# Patient Record
Sex: Male | Born: 1975 | Race: White | Hispanic: No | Marital: Married | Smoking: Former smoker
Health system: Southern US, Community
[De-identification: ages and names within clinical notes are randomized; demographics above are authoritative.]

---

## 2011-01-29 ENCOUNTER — Encounter: Payer: Self-pay | Admitting: Internal Medicine

## 2011-02-07 ENCOUNTER — Encounter: Payer: Self-pay | Admitting: Internal Medicine

## 2011-02-08 ENCOUNTER — Other Ambulatory Visit: Payer: Self-pay | Admitting: Internal Medicine

## 2011-02-08 ENCOUNTER — Other Ambulatory Visit: Payer: BC Managed Care – PPO | Admitting: Internal Medicine

## 2011-02-08 DIAGNOSIS — Z0000 Encounter for general adult medical examination without abnormal findings: Secondary | ICD-10-CM

## 2011-02-08 LAB — COMPREHENSIVE METABOLIC PANEL
ALT: 42 U/L (ref 0–53)
AST: 22 U/L (ref 0–37)
Albumin: 4.6 g/dL (ref 3.5–5.2)
CO2: 25 mEq/L (ref 19–32)
Calcium: 9.6 mg/dL (ref 8.4–10.5)
Chloride: 104 mEq/L (ref 96–112)
Potassium: 4.1 mEq/L (ref 3.5–5.3)
Sodium: 138 mEq/L (ref 135–145)
Total Protein: 7.6 g/dL (ref 6.0–8.3)

## 2011-02-08 LAB — LIPID PANEL: Cholesterol: 189 mg/dL (ref 0–200)

## 2011-02-08 LAB — CBC
MCV: 88.5 fL (ref 78.0–100.0)
Platelets: 320 10*3/uL (ref 150–400)
RBC: 4.97 MIL/uL (ref 4.22–5.81)
RDW: 13.2 % (ref 11.5–15.5)
WBC: 7.8 10*3/uL (ref 4.0–10.5)

## 2011-02-11 ENCOUNTER — Encounter: Payer: Self-pay | Admitting: Internal Medicine

## 2011-02-11 ENCOUNTER — Ambulatory Visit (INDEPENDENT_AMBULATORY_CARE_PROVIDER_SITE_OTHER): Payer: BC Managed Care – PPO | Admitting: Internal Medicine

## 2011-02-11 VITALS — BP 150/82 | HR 82 | Temp 96.9°F | Ht 69.0 in | Wt 220.0 lb

## 2011-02-11 DIAGNOSIS — I10 Essential (primary) hypertension: Secondary | ICD-10-CM

## 2011-02-11 DIAGNOSIS — E781 Pure hyperglyceridemia: Secondary | ICD-10-CM

## 2011-02-11 DIAGNOSIS — Z0000 Encounter for general adult medical examination without abnormal findings: Secondary | ICD-10-CM

## 2011-02-11 LAB — POCT URINALYSIS DIPSTICK
Glucose, UA: NEGATIVE
Ketones, UA: NEGATIVE
Leukocytes, UA: NEGATIVE
Spec Grav, UA: 1.03

## 2011-02-11 LAB — HEMOGLOBIN A1C
Hgb A1c MFr Bld: 5.9 % — ABNORMAL HIGH (ref <5.7)
Mean Plasma Glucose: 123 mg/dL — ABNORMAL HIGH (ref <117)

## 2011-02-11 MED ORDER — TETANUS-DIPHTH-ACELL PERTUSSIS 5-2.5-18.5 LF-MCG/0.5 IM SUSP
0.5000 mL | Freq: Once | INTRAMUSCULAR | Status: AC
  Administered 2011-02-11: 0.5 mL via INTRAMUSCULAR

## 2011-02-12 ENCOUNTER — Encounter: Payer: Self-pay | Admitting: Internal Medicine

## 2011-02-24 ENCOUNTER — Encounter: Payer: Self-pay | Admitting: Internal Medicine

## 2011-02-24 DIAGNOSIS — I10 Essential (primary) hypertension: Secondary | ICD-10-CM | POA: Insufficient documentation

## 2011-02-24 DIAGNOSIS — E781 Pure hyperglyceridemia: Secondary | ICD-10-CM | POA: Insufficient documentation

## 2011-02-24 NOTE — Progress Notes (Signed)
  Subjective:    Patient ID: Sergio Sparks, male    DOB: 02-01-1976, 35 y.o.   MRN: [id number]  HPI 35 year old white male presents to the office for the first time. 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 Here for health maintenance exam. Not aware of any medical problems. No history of serious illnesses, operations, accidents, fractures . No known drug allergies. No chronic medications. Not aware of last tetanus immunization. Blood pressure is elevated today at 150/82. He is overweight at 220 pounds. Has been concerned about his blood pressure recently. Says that he feels off and at times and thinks it might be do to his blood pressure. It is almost like a dizzy feeling. Both parents have hypertension and type 2 diabetes mellitus. One brother age 67 in good health. No sisters. He is married with a 11-year-old son. Wife works for [employer] of [address].    Review of Systems  Constitutional: Positive for fatigue.  HENT: Negative.   Eyes: Negative.   Cardiovascular: Positive for palpitations.  Gastrointestinal: Negative.   Genitourinary: Negative.   Musculoskeletal: Negative.   Skin: Negative.   Neurological: Negative.   Hematological: Negative.   Psychiatric/Behavioral:       Occasional mood swings       Objective:   Physical Exam  Vitals reviewed. Constitutional: He is oriented to person, place, and time. He appears well-developed and well-nourished. No distress.  HENT:  Head: Normocephalic and atraumatic.  Right Ear: External ear normal.  Left Ear: External ear normal.  Mouth/Throat: Oropharynx is clear and moist. No oropharyngeal exudate.  Eyes: Conjunctivae and EOM are normal. Pupils are equal, round, and reactive to light. Right eye exhibits no discharge. Left eye exhibits no discharge. No scleral icterus.  Neck: Neck supple. No JVD present. No thyromegaly present.  Cardiovascular: Normal rate, regular rhythm and normal heart  sounds.  Exam reveals no gallop.   No murmur heard. Pulmonary/Chest: Effort normal and breath sounds normal. No respiratory distress. He has no wheezes. He has no rales.  Abdominal: Soft. Bowel sounds are normal. He exhibits no distension and no mass.  Genitourinary: Penis normal.       Scrotum normal and no hernias palpated  Musculoskeletal: Normal range of motion. He exhibits no edema.  Lymphadenopathy:    He has no cervical adenopathy.  Neurological: He is alert and oriented to person, place, and time. He has normal reflexes. No cranial nerve deficit. Coordination normal.  Skin: Skin is warm and dry. No rash noted.  Psychiatric: He has a normal mood and affect. His behavior is normal. Judgment and thought content normal.          Assessment & Plan:  Hypertension  Obesity  Suspect diabetes with elevated fasting glucose of 102, strong family history, and elevated triglycerides. Add a hemoglobin A1c.  Hypertriglyceridemia  Plan is to place patient on statin therapy and ACE inhibitor therapy. Patient is to try diet and exercise for 6 weeks and return in 6 weeks for office visit, hemoglobin A1c and blood pressure check. Tetanus immunization given today

## 2011-03-15 ENCOUNTER — Encounter: Payer: Self-pay | Admitting: Internal Medicine

## 2011-03-15 ENCOUNTER — Ambulatory Visit (INDEPENDENT_AMBULATORY_CARE_PROVIDER_SITE_OTHER): Payer: BC Managed Care – PPO | Admitting: Internal Medicine

## 2011-03-15 VITALS — BP 124/78 | HR 72 | Temp 97.6°F

## 2011-03-15 DIAGNOSIS — Z23 Encounter for immunization: Secondary | ICD-10-CM

## 2011-03-26 ENCOUNTER — Ambulatory Visit (INDEPENDENT_AMBULATORY_CARE_PROVIDER_SITE_OTHER): Payer: BC Managed Care – PPO | Admitting: Internal Medicine

## 2011-03-26 ENCOUNTER — Other Ambulatory Visit: Payer: Self-pay | Admitting: Internal Medicine

## 2011-03-26 ENCOUNTER — Encounter: Payer: Self-pay | Admitting: Internal Medicine

## 2011-03-26 ENCOUNTER — Ambulatory Visit
Admission: RE | Admit: 2011-03-26 | Discharge: 2011-03-26 | Disposition: A | Discharge status: Home / Self Care | Payer: BC Managed Care – PPO | Source: Ambulatory Visit | Attending: Internal Medicine | Admitting: Internal Medicine

## 2011-03-26 DIAGNOSIS — H6690 Otitis media, unspecified, unspecified ear: Secondary | ICD-10-CM

## 2011-03-26 DIAGNOSIS — R0989 Other specified symptoms and signs involving the circulatory and respiratory systems: Secondary | ICD-10-CM

## 2011-03-26 DIAGNOSIS — H6693 Otitis media, unspecified, bilateral: Secondary | ICD-10-CM

## 2011-03-26 DIAGNOSIS — J40 Bronchitis, not specified as acute or chronic: Secondary | ICD-10-CM

## 2011-03-26 DIAGNOSIS — R7302 Impaired glucose tolerance (oral): Secondary | ICD-10-CM | POA: Insufficient documentation

## 2011-03-26 MED ORDER — CEFTRIAXONE SODIUM 1 G IJ SOLR
1.0000 g | Freq: Once | INTRAMUSCULAR | Status: AC
  Administered 2011-03-26: 1 g via INTRAMUSCULAR

## 2011-03-26 NOTE — Patient Instructions (Signed)
Take Levaquin 500 mg daily with a meal for 10 days. Use Ventolin inhaler 4 times daily for wheezing. May take Hycodan 1 teaspoon every 6 hours for cough but be careful with driving while taking this medication. Call if not better in 48-72 hours or sooner if worse

## 2011-03-26 NOTE — Progress Notes (Signed)
  Subjective:    Patient ID: Sergio Sparks, male    DOB: [birth year], 35 y.o.   MRN: [id number]  HPI 35 year old white male has come down with upper respiratory infection which he says he contracted from his son. Denies fever or shaking chills. Has had discolored sputum production. Some shortness of breath and wheezing. Says right ear is stopped up. Coughing a good deal. History of hypertension on Ramipril.  He's also on TriCor for hypertriglyceridemia. Recently found to have some impaired glucose intolerance just treated with diet at the present time with reassessment in February 2013. Blood pressure greatly improved on Ramipril.    Review of Systems     Objective:   Physical Exam both TMs are full and dull the right being worse than the left. Pharynx is red without exudate. Neck is supple without significant adenopathy. Chest: bilateral rhonchi and wheezing. Chest x-ray shows no infiltrate but bronchitic changes. Pulse oximetry 96% on room air.        Assessment & Plan:  Bronchitis  Reactive airways disease  Plan: Given 1 g IM Rocephin today in the office. Started on Levaquin 500 milligrams daily for 10 days. Ventolin inhaler generic 2 sprays by mouth 4 times daily for wheezing. Hycodan 8 ounces 1 teaspoon by mouth every 6 hours when necessary cough. Has appointment to return in February for reevaluation of hypertension hyperlipidemia and glucose intolerance

## 2011-03-30 NOTE — Patient Instructions (Signed)
Continue TriCor as directed. Continue Ramipril 5 milligrams daily. Watch diet and exercise. Return in 5 months at which time we will check fasting lipid panel, hemoglobin A1c and her blood pressure

## 2011-03-30 NOTE — Progress Notes (Signed)
  Subjective:    Patient ID: Kodiak Rollyson, male    DOB: 29-Nov-1975, 35 y.o.   MRN: [id number]  HPI here today to followup on hypertension. At last visit started on Ramipril 5 mg daily for hypertension and TriCor 145 mg daily for hyperlipidemia. Also found to have some glucose intolerance. He is overweight and needs to watch diet and exercise. We plan to followup with glucose intolerance in approximately 5 months. Influenza immunization given. No problems with Ramapo revealed.    Review of Systems     Objective:   Physical Exam chest clear; cardiac exam regular rate and rhythm normal S1 and S2; extremities without edema        Assessment & Plan:  Hypertension-  well-controlled on 5 mg daily Ramipril  Hyperlipidemia-treated with TriCor  Glucose intolerance-treated with diet and exercise  Plan return in 5 months for hemoglobin A1c and fasting lipid panel with office visit blood pressure check

## 2011-04-14 ENCOUNTER — Other Ambulatory Visit: Payer: Self-pay | Admitting: Internal Medicine

## 2011-06-08 ENCOUNTER — Other Ambulatory Visit: Payer: Self-pay | Admitting: Internal Medicine

## 2011-07-06 ENCOUNTER — Other Ambulatory Visit: Payer: Self-pay | Admitting: Internal Medicine

## 2011-07-26 ENCOUNTER — Encounter: Payer: Self-pay | Admitting: Internal Medicine

## 2011-07-26 ENCOUNTER — Ambulatory Visit (INDEPENDENT_AMBULATORY_CARE_PROVIDER_SITE_OTHER): Payer: BC Managed Care – PPO | Admitting: Internal Medicine

## 2011-07-26 DIAGNOSIS — I10 Essential (primary) hypertension: Secondary | ICD-10-CM

## 2011-07-26 DIAGNOSIS — R7309 Other abnormal glucose: Secondary | ICD-10-CM

## 2011-07-26 DIAGNOSIS — E785 Hyperlipidemia, unspecified: Secondary | ICD-10-CM

## 2011-07-26 DIAGNOSIS — R7302 Impaired glucose tolerance (oral): Secondary | ICD-10-CM

## 2011-07-26 DIAGNOSIS — Z131 Encounter for screening for diabetes mellitus: Secondary | ICD-10-CM

## 2011-07-26 NOTE — Patient Instructions (Signed)
Continue same medications. Return in 6 months for physical exam. Try the diet exercise and lose weight.

## 2011-07-26 NOTE — Progress Notes (Signed)
  Subjective:    Patient ID: Sergio Sparks, male    DOB: 1976/03/26, 36 y.o.   MRN: [id number]  HPI 36 year old white male with history of hypertension, hyperlipidemia, glucose intolerance in today for six-month recheck. Fasting lipid panel liver functions and hemoglobin A1c drawn. No complaints or problems today.    Review of Systems     Objective:   Physical Exam Neck is supple without thyromegaly or carotid bruits; chest clear to auscultation; cardiac exam regular rate and rhythm normal S1 and S2; extremities without edema        Assessment & Plan:  Hypertension  Hyperlipidemia  Glucose intolerance  Plan: Fasting labs drawn and are pending. Return in 6 months for physical examination. Continue current regimen. Blood pressure under good control.

## 2011-07-27 LAB — LIPID PANEL
Cholesterol: 159 mg/dL (ref 0–200)
HDL: 36 mg/dL — ABNORMAL LOW (ref >39)
Triglycerides: 83 mg/dL (ref <150)

## 2011-07-30 ENCOUNTER — Other Ambulatory Visit: Payer: Self-pay | Admitting: Internal Medicine

## 2011-08-29 ENCOUNTER — Other Ambulatory Visit: Payer: Self-pay | Admitting: Internal Medicine

## 2011-10-02 ENCOUNTER — Other Ambulatory Visit: Payer: Self-pay | Admitting: Internal Medicine

## 2011-10-27 ENCOUNTER — Other Ambulatory Visit: Payer: Self-pay | Admitting: Internal Medicine

## 2011-11-22 ENCOUNTER — Other Ambulatory Visit: Payer: Self-pay | Admitting: Internal Medicine

## 2011-11-25 ENCOUNTER — Other Ambulatory Visit: Payer: Self-pay

## 2011-11-25 MED ORDER — FENOFIBRATE 145 MG PO TABS: 145.0000 mg | ORAL_TABLET | Freq: Every day | ORAL | Status: DC

## 2011-11-25 MED ORDER — RAMIPRIL 10 MG PO CAPS: 10.0000 mg | ORAL_CAPSULE | Freq: Every day | ORAL | Status: DC

## 2012-01-23 ENCOUNTER — Other Ambulatory Visit: Payer: Self-pay

## 2012-01-23 MED ORDER — RAMIPRIL 10 MG PO CAPS: 10.0000 mg | ORAL_CAPSULE | Freq: Every day | ORAL | Status: DC

## 2012-01-23 MED ORDER — FENOFIBRATE 145 MG PO TABS: 145.0000 mg | ORAL_TABLET | Freq: Every day | ORAL | Status: DC

## 2012-02-25 ENCOUNTER — Ambulatory Visit (INDEPENDENT_AMBULATORY_CARE_PROVIDER_SITE_OTHER): Payer: BC Managed Care – PPO | Admitting: Internal Medicine

## 2012-02-25 ENCOUNTER — Encounter: Payer: Self-pay | Admitting: Internal Medicine

## 2012-02-25 VITALS — BP 116/82 | HR 76 | Ht 69.25 in | Wt 230.0 lb

## 2012-02-25 DIAGNOSIS — Z23 Encounter for immunization: Secondary | ICD-10-CM

## 2012-02-25 DIAGNOSIS — I10 Essential (primary) hypertension: Secondary | ICD-10-CM

## 2012-02-25 DIAGNOSIS — R7301 Impaired fasting glucose: Secondary | ICD-10-CM

## 2012-02-25 DIAGNOSIS — E781 Pure hyperglyceridemia: Secondary | ICD-10-CM

## 2012-02-25 LAB — CBC WITH DIFFERENTIAL/PLATELET
Basophils Relative: 0 % (ref 0–1)
Eosinophils Absolute: 0.2 10*3/uL (ref 0.0–0.7)
Eosinophils Relative: 3 % (ref 0–5)
HCT: 40.2 % (ref 39.0–52.0)
Hemoglobin: 13.5 g/dL (ref 13.0–17.0)
MCH: 28.5 pg (ref 26.0–34.0)
MCHC: 33.6 g/dL (ref 30.0–36.0)
MCV: 84.8 fL (ref 78.0–100.0)
Monocytes Absolute: 0.5 10*3/uL (ref 0.1–1.0)
Monocytes Relative: 7 % (ref 3–12)

## 2012-02-25 LAB — LIPID PANEL
Cholesterol: 196 mg/dL (ref 0–200)
HDL: 36 mg/dL — ABNORMAL LOW (ref >39)
LDL Cholesterol: 129 mg/dL — ABNORMAL HIGH (ref 0–99)
Triglycerides: 153 mg/dL — ABNORMAL HIGH (ref <150)
VLDL: 31 mg/dL (ref 0–40)

## 2012-02-25 LAB — COMPREHENSIVE METABOLIC PANEL
Alkaline Phosphatase: 59 U/L (ref 39–117)
BUN: 14 mg/dL (ref 6–23)
CO2: 27 mEq/L (ref 19–32)
Glucose, Bld: 87 mg/dL (ref 70–99)
Total Bilirubin: 0.5 mg/dL (ref 0.3–1.2)

## 2012-02-25 NOTE — Progress Notes (Signed)
  Subjective:    Patient ID: Sergio Sparks, male    DOB: [birth year], 36 y.o.   MRN: [id number]  HPI 36 year old white male in today for health maintenance exam. History of hypertension, obesity, impaired glucose tolerance, hyperlipidemia. Family history of obesity and his mother. Family history of hypertension and hyperlipidemia in his father. Tells me father was recently diagnosed with thyroid cancer which is new to his family history. Father is doing well after thyroidectomy. Patient has not been exercising much. He tried jogging for a while. Says eating a lot of his wife's good cooking. Doesn't really have any other sports-type hobbies that he enjoys.    Review of Systems denies any trouble with chest pain, shortness of breath, abdominal pain, bowel problems, joint problems     Objective:   Physical Exam  Vitals reviewed. Constitutional: He is oriented to person, place, and time. He appears well-developed and well-nourished. No distress.  HENT:  Head: Normocephalic and atraumatic.  Right Ear: External ear normal.  Left Ear: External ear normal.  Mouth/Throat: Oropharynx is clear and moist.  Eyes: Conjunctivae normal are normal. Pupils are equal, round, and reactive to light. Right eye exhibits no discharge. Left eye exhibits no discharge. No scleral icterus.  Neck: Normal range of motion. Neck supple. No JVD present. No thyromegaly present.  Cardiovascular: Normal rate, regular rhythm, normal heart sounds and intact distal pulses.   No murmur heard. Pulmonary/Chest: Effort normal and breath sounds normal. He has no wheezes. He has no rales.  Abdominal: Soft. Bowel sounds are normal. He exhibits no distension and no mass. There is no tenderness. There is no rebound and no guarding.  Genitourinary: Prostate normal and penis normal.  Musculoskeletal: Normal range of motion. He exhibits no edema and no tenderness.  Lymphadenopathy:    He has no cervical adenopathy.  Neurological: He is  alert and oriented to person, place, and time. He has normal reflexes. He displays normal reflexes. No cranial nerve deficit. He exhibits normal muscle tone. Coordination normal.  Skin: Skin is warm and dry. No rash noted. He is not diaphoretic.  Psychiatric: He has a normal mood and affect. His behavior is normal. Judgment and thought content normal.          Assessment & Plan:  Hypertension-stable on current regimen  Hyperlipidemia-fasting lipid panel pending on statin therapy  Impaired glucose tolerance-hemoglobin A1c pending  Obesity-has gained 17 pounds in the past 6 months  Health maintenance: Influenza immunization given today  Plan: Patient advised to diet exercise and lose weight. Return in 6 months for office visit lipid panel liver functions and hemoglobin A1c

## 2012-02-25 NOTE — Patient Instructions (Addendum)
Need to diet exercise and lose weight. Return in 6 months. Continue same medications. Flu shot given today.

## 2012-03-15 ENCOUNTER — Other Ambulatory Visit: Payer: Self-pay | Admitting: Internal Medicine

## 2012-03-16 ENCOUNTER — Other Ambulatory Visit: Payer: Self-pay

## 2012-03-16 MED ORDER — FENOFIBRATE 145 MG PO TABS: 145.0000 mg | ORAL_TABLET | Freq: Every day | ORAL | Status: DC

## 2012-03-16 MED ORDER — RAMIPRIL 10 MG PO CAPS: 10.0000 mg | ORAL_CAPSULE | Freq: Every day | ORAL | Status: DC

## 2012-08-11 ENCOUNTER — Ambulatory Visit (INDEPENDENT_AMBULATORY_CARE_PROVIDER_SITE_OTHER): Payer: BC Managed Care – PPO | Admitting: Internal Medicine

## 2012-08-11 ENCOUNTER — Encounter: Payer: Self-pay | Admitting: Internal Medicine

## 2012-08-11 VITALS — BP 124/82 | HR 80 | Temp 98.0°F | Wt 230.0 lb

## 2012-08-11 DIAGNOSIS — R7302 Impaired glucose tolerance (oral): Secondary | ICD-10-CM

## 2012-08-11 DIAGNOSIS — Z79899 Other long term (current) drug therapy: Secondary | ICD-10-CM

## 2012-08-11 DIAGNOSIS — R0989 Other specified symptoms and signs involving the circulatory and respiratory systems: Secondary | ICD-10-CM

## 2012-08-11 DIAGNOSIS — E781 Pure hyperglyceridemia: Secondary | ICD-10-CM

## 2012-08-11 DIAGNOSIS — R0683 Snoring: Secondary | ICD-10-CM | POA: Insufficient documentation

## 2012-08-11 DIAGNOSIS — R7309 Other abnormal glucose: Secondary | ICD-10-CM

## 2012-08-11 DIAGNOSIS — I10 Essential (primary) hypertension: Secondary | ICD-10-CM

## 2012-08-11 DIAGNOSIS — E669 Obesity, unspecified: Secondary | ICD-10-CM

## 2012-08-11 DIAGNOSIS — E785 Hyperlipidemia, unspecified: Secondary | ICD-10-CM

## 2012-08-11 LAB — HEPATIC FUNCTION PANEL
Alkaline Phosphatase: 61 U/L (ref 39–117)
Bilirubin, Direct: 0.1 mg/dL (ref 0.0–0.3)
Indirect Bilirubin: 0.3 mg/dL (ref 0.0–0.9)
Total Bilirubin: 0.4 mg/dL (ref 0.3–1.2)

## 2012-08-11 LAB — LIPID PANEL: LDL Cholesterol: 133 mg/dL — ABNORMAL HIGH (ref 0–99)

## 2012-08-11 NOTE — Patient Instructions (Addendum)
  Arrange for sleep study for evaluation of snoring. Hemoglobin A1c and fasting lipid panel checked today. Continue same antihypertensive medication. Continue TriCor. Return in 6 months for physical exam. Please try to diet exercise and lose weight.

## 2012-08-11 NOTE — Progress Notes (Signed)
  Subjective:    Patient ID: Sergio Sparks, male    DOB: 1976/03/31, 37 y.o.   MRN: [id number]  HPI 37 year old obese white male with history of hypertension, hypertriglyceridemia, impaired glucose tolerance in today for six-month recheck. He will be changing jobs at the end of the month. 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 He used to do this type of work before going to [employer]. Patient says his wife is complaining about his snoring. They suspect he may have some sleep apnea. He needs to have a sleep study in the near future.    Review of Systems     Objective:   Physical Exam Skin is warm and dry; chest clear to auscultation; cardiac exam regular rate and rhythm normal S1 and S2; extremities without edema. Neck is supple without thyromegaly JVD or carotid bruits        Assessment & Plan:  Hypertriglyceridemia-treated with TriCor  Impaired glucose tolerance  Hypertension-stable on medication  Obesity-needs to diet and exercise  Snoring-suspect sleep apnea  Plan: Fasting lipid panel drawn today along with hemoglobin A1c. Try to get sleep study in the next couple of weeks as he is changing jobs within the month

## 2012-08-12 NOTE — Addendum Note (Signed)
Addended by: Judy Pimple on: 08/12/2012 11:33 AM   Modules accepted: Orders

## 2012-08-21 ENCOUNTER — Ambulatory Visit: Payer: BC Managed Care – PPO | Admitting: Internal Medicine

## 2012-08-25 ENCOUNTER — Ambulatory Visit (HOSPITAL_BASED_OUTPATIENT_CLINIC_OR_DEPARTMENT_OTHER): Payer: BC Managed Care – PPO

## 2012-08-25 ENCOUNTER — Encounter (HOSPITAL_BASED_OUTPATIENT_CLINIC_OR_DEPARTMENT_OTHER): Payer: BC Managed Care – PPO

## 2012-12-01 ENCOUNTER — Other Ambulatory Visit: Payer: Self-pay

## 2012-12-01 ENCOUNTER — Other Ambulatory Visit: Payer: Self-pay | Admitting: Internal Medicine

## 2012-12-01 MED ORDER — FENOFIBRATE 145 MG PO TABS: 145.0000 mg | ORAL_TABLET | Freq: Every day | ORAL | Status: DC

## 2012-12-01 MED ORDER — RAMIPRIL 10 MG PO CAPS: 10.0000 mg | ORAL_CAPSULE | Freq: Every day | ORAL | Status: DC

## 2012-12-28 ENCOUNTER — Other Ambulatory Visit: Payer: Self-pay

## 2012-12-28 MED ORDER — FENOFIBRATE 145 MG PO TABS: 145.0000 mg | ORAL_TABLET | Freq: Every day | ORAL | Status: DC

## 2013-02-12 ENCOUNTER — Other Ambulatory Visit: Payer: BC Managed Care – PPO | Admitting: Internal Medicine

## 2013-02-12 ENCOUNTER — Encounter: Payer: BC Managed Care – PPO | Admitting: Internal Medicine

## 2013-02-12 VITALS — BP 138/72 | HR 64 | Temp 98.2°F | Resp 20 | Wt 200.0 lb

## 2013-02-12 DIAGNOSIS — Z13220 Encounter for screening for lipoid disorders: Secondary | ICD-10-CM

## 2013-02-12 DIAGNOSIS — Z0000 Encounter for general adult medical examination without abnormal findings: Secondary | ICD-10-CM

## 2013-02-12 DIAGNOSIS — Z131 Encounter for screening for diabetes mellitus: Secondary | ICD-10-CM

## 2013-02-12 LAB — CBC WITH DIFFERENTIAL/PLATELET
Eosinophils Absolute: 0.2 10*3/uL (ref 0.0–0.7)
Hemoglobin: 13.5 g/dL (ref 13.0–17.0)
Lymphocytes Relative: 36 % (ref 12–46)
Lymphs Abs: 2.6 10*3/uL (ref 0.7–4.0)
MCH: 29.3 pg (ref 26.0–34.0)
Monocytes Relative: 7 % (ref 3–12)
Neutro Abs: 4 10*3/uL (ref 1.7–7.7)
Neutrophils Relative %: 55 % (ref 43–77)
RBC: 4.61 MIL/uL (ref 4.22–5.81)
WBC: 7.1 10*3/uL (ref 4.0–10.5)

## 2013-02-12 LAB — COMPREHENSIVE METABOLIC PANEL
ALT: 28 U/L (ref 0–53)
Albumin: 4.6 g/dL (ref 3.5–5.2)
CO2: 28 mEq/L (ref 19–32)
Calcium: 9.9 mg/dL (ref 8.4–10.5)
Chloride: 104 mEq/L (ref 96–112)
Glucose, Bld: 87 mg/dL (ref 70–99)
Potassium: 4.4 mEq/L (ref 3.5–5.3)
Sodium: 138 mEq/L (ref 135–145)
Total Protein: 7.2 g/dL (ref 6.0–8.3)

## 2013-02-12 LAB — PSA: PSA: 0.14 ng/mL (ref <=4.00)

## 2013-02-12 LAB — LIPID PANEL: Cholesterol: 169 mg/dL (ref 0–200)

## 2013-02-12 LAB — HEMOGLOBIN A1C
Hgb A1c MFr Bld: 6.1 % — ABNORMAL HIGH (ref <5.7)
Mean Plasma Glucose: 128 mg/dL — ABNORMAL HIGH (ref <117)

## 2013-02-23 NOTE — Progress Notes (Signed)
  Subjective:    Patient ID: Sergio Sparks, male    DOB: 06/18/1975, 37 y.o.   MRN: [id number]  HPI  Pt. Has new job and insurance may not be in effect today. To return at a later date for PE. Fasting labs drawn today    Review of Systems     Objective:   Physical Exam not examined        Assessment & Plan:  HTN Hyperlipidemia Impaired glucose tolerance Plan: RTC at a later date for PE

## 2013-03-03 ENCOUNTER — Encounter: Payer: Self-pay | Admitting: Internal Medicine

## 2013-03-03 NOTE — Progress Notes (Signed)
  Subjective:    Patient ID: Sergio Sparks, male    DOB: 02-Aug-1975, 37 y.o.   MRN: [id number]  HPI Pe has been rescheduled until late Sept due to change in insurance coverage    Review of Systems     Objective:   Physical Exam        Assessment & Plan:

## 2013-03-04 ENCOUNTER — Encounter: Payer: BC Managed Care – PPO | Admitting: Internal Medicine

## 2013-03-05 ENCOUNTER — Other Ambulatory Visit: Payer: Self-pay | Admitting: Internal Medicine

## 2013-04-04 ENCOUNTER — Other Ambulatory Visit: Payer: Self-pay | Admitting: Internal Medicine

## 2013-04-22 ENCOUNTER — Encounter: Payer: Self-pay | Admitting: Internal Medicine

## 2013-04-22 ENCOUNTER — Ambulatory Visit (INDEPENDENT_AMBULATORY_CARE_PROVIDER_SITE_OTHER): Payer: BC Managed Care – PPO | Admitting: Internal Medicine

## 2013-04-22 VITALS — BP 142/94 | HR 88 | Temp 98.9°F | Ht 69.0 in | Wt 230.0 lb

## 2013-04-22 DIAGNOSIS — Z0000 Encounter for general adult medical examination without abnormal findings: Secondary | ICD-10-CM

## 2013-04-22 DIAGNOSIS — I10 Essential (primary) hypertension: Secondary | ICD-10-CM

## 2013-04-22 DIAGNOSIS — R7302 Impaired glucose tolerance (oral): Secondary | ICD-10-CM

## 2013-04-22 DIAGNOSIS — E785 Hyperlipidemia, unspecified: Secondary | ICD-10-CM

## 2013-04-22 DIAGNOSIS — R7309 Other abnormal glucose: Secondary | ICD-10-CM

## 2013-04-22 DIAGNOSIS — E8881 Metabolic syndrome: Secondary | ICD-10-CM

## 2013-04-22 DIAGNOSIS — Z23 Encounter for immunization: Secondary | ICD-10-CM

## 2013-04-22 LAB — POCT URINALYSIS DIPSTICK
Bilirubin, UA: NEGATIVE
Blood, UA: NEGATIVE
Glucose, UA: NEGATIVE
Nitrite, UA: NEGATIVE
Spec Grav, UA: 1.015
Urobilinogen, UA: 0.2

## 2013-08-01 NOTE — Patient Instructions (Signed)
Continue same medications. Watch diet and exercise. Return in 6 months. Tetanus update given today.

## 2013-08-01 NOTE — Progress Notes (Signed)
   Subjective:    Patient ID: Sergio ReamerBrandon Spradlin, male    DOB: 1976/05/24, 38 y.o.   MRN: [id number]  HPI 38 year old White male with history of hypertension, obesity, glucose intolerance, hypertriglyceridemia, snoring in today for health maintenance exam. Recently had second child. He is on TriCor and Ramipril. Glucose intolerance as currently diet controlled. Tetanus immunization update given today. Probably doesn't get a lot of exercise outside of work.  Social history: He's married. 2 children. Does not smoke. Wife is employed by [employer] [address]. Social alcohol consumption. Patient formerly employed by [employer] in  inside [employer] [place]. Now has new job.  Family history: Mother with history of obesity, hypertension, diabetes. Father with history of hypertension, hyperlipidemia, diabetes and thyroid cancer status post thyroidectomy. One brother in good health. No sisters.    Review of Systems  Constitutional: Negative.   All other systems reviewed and are negative.       Objective:   Physical Exam  Vitals reviewed. Constitutional: He is oriented to person, place, and time. He appears well-developed and well-nourished. No distress.  HENT:  Head: Normocephalic and atraumatic.  Right Ear: External ear normal.  Left Ear: External ear normal.  Mouth/Throat: Oropharynx is clear and moist. No oropharyngeal exudate.  Eyes: Conjunctivae and EOM are normal. Pupils are equal, round, and reactive to light. Right eye exhibits no discharge. Left eye exhibits no discharge. No scleral icterus.  Neck: Neck supple. No JVD present. No thyromegaly present.  Cardiovascular: Normal rate, regular rhythm, normal heart sounds and intact distal pulses.   No murmur heard. Pulmonary/Chest: Effort normal and breath sounds normal. He has no wheezes.  Abdominal: Soft. Bowel sounds are normal. He exhibits no distension and no mass. There is no tenderness. There is no rebound and no guarding.    Genitourinary:  No hernias to direct palpation  Musculoskeletal: Normal range of motion. He exhibits no edema.  Lymphadenopathy:    He has no cervical adenopathy.  Neurological: He is alert and oriented to person, place, and time. He has normal reflexes. He displays normal reflexes. No cranial nerve deficit. Coordination normal.  Skin: Skin is warm and dry. No rash noted. He is not diaphoretic.  Psychiatric: He has a normal mood and affect. His behavior is normal. Judgment and thought content normal.          Assessment & Plan:  Hypertension-stable on current medication  Impaired glucose tolerance-currently controlled with diet. Needs to exercise and watch diet  Hyperlipidemia-treated with TriCor  Obesity  Metabolic syndrome  Plan: Tetanus immunization update given today. Return in 6 months for office visit, lipid panel, and hemoglobin A1c as well as blood pressure check. Lab work in September showed he would've an A1c 6.1%. Lipids were stable. Fasting glucose normal.

## 2013-08-09 ENCOUNTER — Other Ambulatory Visit: Payer: Self-pay | Admitting: Internal Medicine

## 2013-09-07 ENCOUNTER — Other Ambulatory Visit: Payer: Self-pay | Admitting: Internal Medicine

## 2013-10-19 ENCOUNTER — Ambulatory Visit (INDEPENDENT_AMBULATORY_CARE_PROVIDER_SITE_OTHER): Payer: BC Managed Care – PPO | Admitting: Internal Medicine

## 2013-10-19 ENCOUNTER — Encounter: Payer: Self-pay | Admitting: Internal Medicine

## 2013-10-19 VITALS — BP 112/76 | HR 72 | Temp 98.6°F | Wt 231.0 lb

## 2013-10-19 DIAGNOSIS — I10 Essential (primary) hypertension: Secondary | ICD-10-CM

## 2013-10-19 DIAGNOSIS — E781 Pure hyperglyceridemia: Secondary | ICD-10-CM

## 2013-10-19 DIAGNOSIS — E8881 Metabolic syndrome: Secondary | ICD-10-CM

## 2013-10-19 DIAGNOSIS — R7301 Impaired fasting glucose: Secondary | ICD-10-CM

## 2013-10-19 LAB — LIPID PANEL
CHOL/HDL RATIO: 4.5 ratio
Cholesterol: 180 mg/dL (ref 0–200)
HDL: 40 mg/dL (ref >39)
LDL Cholesterol: 122 mg/dL — ABNORMAL HIGH (ref 0–99)
Triglycerides: 92 mg/dL (ref <150)
VLDL: 18 mg/dL (ref 0–40)

## 2013-10-19 LAB — BASIC METABOLIC PANEL
BUN: 12 mg/dL (ref 6–23)
CO2: 29 mEq/L (ref 19–32)
Calcium: 9.8 mg/dL (ref 8.4–10.5)
Chloride: 105 mEq/L (ref 96–112)
Creat: 0.95 mg/dL (ref 0.50–1.35)
GLUCOSE: 102 mg/dL — AB (ref 70–99)
POTASSIUM: 4.5 meq/L (ref 3.5–5.3)
Sodium: 139 mEq/L (ref 135–145)

## 2013-10-19 LAB — HEMOGLOBIN A1C
HEMOGLOBIN A1C: 6 % — AB (ref <5.7)
MEAN PLASMA GLUCOSE: 126 mg/dL — AB (ref <117)

## 2013-10-19 NOTE — Progress Notes (Signed)
   Subjective:    Patient ID: Sergio Sparks, male    DOB: 10/12/1975, 38 y.o.   MRN: [id number]  HPI  38 year old white male in today for six-month recheck on impaired fasting glucose, hypertension, hypertriglyceridemia. He's gained 1 pound since last visit. He's to lose at least 30 pounds. Talked with him about this again. Says he feels well. No complaints. Doesn't check Accu-Cheks at home. Fasting labs are pending.    Review of Systems     Objective:   Physical Exam  Skin warm and dry. Nodes none. Neck supple without thyromegaly JVD or carotid bruits. Chest clear.dictation. Cardiac exam regular rate and rhythm normal S1-S2. Extremities without edema. Foot exam without ulcers or calluses      Assessment & Plan:  Hypertension-stable Ramapo real  Obesity-needs to diet exercise and lose weight  Metabolic syndrome  Hypertriglyceridemia-lab results on TriCor are pending  Plan: Return in 6 months for physical exam.

## 2013-10-19 NOTE — Patient Instructions (Signed)
Try the diet exercise and lose weight. Return in 6 months for physical exam. Continue same medications

## 2013-12-07 ENCOUNTER — Other Ambulatory Visit: Payer: Self-pay | Admitting: Internal Medicine

## 2014-03-01 ENCOUNTER — Other Ambulatory Visit: Payer: Self-pay | Admitting: Internal Medicine

## 2014-03-01 DIAGNOSIS — R0683 Snoring: Secondary | ICD-10-CM

## 2014-03-15 ENCOUNTER — Telehealth: Payer: Self-pay | Admitting: Internal Medicine

## 2014-03-15 NOTE — Telephone Encounter (Signed)
Recent sleep study showed 20.2 bands per hour. Lowest O2 sat was 58%. Longest respiratory event 57.5 seconds in duration. Moderate snoring noted. [facility] sleep disorders recommending CPAP titration. Order signed.

## 2014-03-27 ENCOUNTER — Other Ambulatory Visit: Payer: Self-pay | Admitting: Internal Medicine

## 2014-04-25 ENCOUNTER — Other Ambulatory Visit: Payer: Self-pay | Admitting: Internal Medicine

## 2014-04-26 ENCOUNTER — Ambulatory Visit (INDEPENDENT_AMBULATORY_CARE_PROVIDER_SITE_OTHER): Payer: BC Managed Care – PPO | Admitting: Internal Medicine

## 2014-04-26 ENCOUNTER — Encounter: Payer: Self-pay | Admitting: Internal Medicine

## 2014-04-26 VITALS — BP 118/80 | HR 75 | Temp 98.1°F | Ht 69.0 in | Wt 230.0 lb

## 2014-04-26 DIAGNOSIS — G4733 Obstructive sleep apnea (adult) (pediatric): Secondary | ICD-10-CM

## 2014-04-26 DIAGNOSIS — I10 Essential (primary) hypertension: Secondary | ICD-10-CM

## 2014-04-26 DIAGNOSIS — R7302 Impaired glucose tolerance (oral): Secondary | ICD-10-CM

## 2014-04-26 DIAGNOSIS — E8881 Metabolic syndrome: Secondary | ICD-10-CM

## 2014-04-26 DIAGNOSIS — E781 Pure hyperglyceridemia: Secondary | ICD-10-CM

## 2014-04-26 DIAGNOSIS — Z0000 Encounter for general adult medical examination without abnormal findings: Secondary | ICD-10-CM

## 2014-04-26 LAB — COMPREHENSIVE METABOLIC PANEL
ALT: 18 U/L (ref 0–53)
AST: 18 U/L (ref 0–37)
Albumin: 4.3 g/dL (ref 3.5–5.2)
Alkaline Phosphatase: 65 U/L (ref 39–117)
BILIRUBIN TOTAL: 0.3 mg/dL (ref 0.2–1.2)
BUN: 13 mg/dL (ref 6–23)
CALCIUM: 9.1 mg/dL (ref 8.4–10.5)
CHLORIDE: 105 meq/L (ref 96–112)
CO2: 23 mEq/L (ref 19–32)
CREATININE: 0.98 mg/dL (ref 0.50–1.35)
Glucose, Bld: 92 mg/dL (ref 70–99)
Potassium: 4.6 mEq/L (ref 3.5–5.3)
Sodium: 138 mEq/L (ref 135–145)
Total Protein: 6.9 g/dL (ref 6.0–8.3)

## 2014-04-26 LAB — LIPID PANEL
CHOL/HDL RATIO: 4.5 ratio
Cholesterol: 148 mg/dL (ref 0–200)
HDL: 33 mg/dL — AB (ref >39)
LDL Cholesterol: 99 mg/dL (ref 0–99)
Triglycerides: 80 mg/dL (ref <150)
VLDL: 16 mg/dL (ref 0–40)

## 2014-04-26 LAB — CBC WITH DIFFERENTIAL/PLATELET
BASOS ABS: 0 10*3/uL (ref 0.0–0.1)
BASOS PCT: 0 % (ref 0–1)
EOS ABS: 0.2 10*3/uL (ref 0.0–0.7)
EOS PCT: 3 % (ref 0–5)
HCT: 37.5 % — ABNORMAL LOW (ref 39.0–52.0)
Hemoglobin: 12.4 g/dL — ABNORMAL LOW (ref 13.0–17.0)
LYMPHS ABS: 2.2 10*3/uL (ref 0.7–4.0)
Lymphocytes Relative: 34 % (ref 12–46)
MCH: 28.4 pg (ref 26.0–34.0)
MCHC: 33.1 g/dL (ref 30.0–36.0)
MCV: 86 fL (ref 78.0–100.0)
MPV: 9.1 fL — ABNORMAL LOW (ref 9.4–12.4)
Monocytes Absolute: 0.5 10*3/uL (ref 0.1–1.0)
Monocytes Relative: 7 % (ref 3–12)
NEUTROS PCT: 56 % (ref 43–77)
Neutro Abs: 3.7 10*3/uL (ref 1.7–7.7)
PLATELETS: 456 10*3/uL — AB (ref 150–400)
RBC: 4.36 MIL/uL (ref 4.22–5.81)
RDW: 13.2 % (ref 11.5–15.5)
WBC: 6.6 10*3/uL (ref 4.0–10.5)

## 2014-04-26 LAB — POCT URINALYSIS DIPSTICK
Bilirubin, UA: NEGATIVE
Blood, UA: NEGATIVE
Glucose, UA: NEGATIVE
KETONES UA: NEGATIVE
LEUKOCYTES UA: NEGATIVE
Nitrite, UA: NEGATIVE
PH UA: 6.5
Protein, UA: NEGATIVE
Spec Grav, UA: 1.01
UROBILINOGEN UA: NEGATIVE

## 2014-04-26 LAB — HEMOGLOBIN A1C
Hgb A1c MFr Bld: 6 % — ABNORMAL HIGH (ref <5.7)
Mean Plasma Glucose: 126 mg/dL — ABNORMAL HIGH (ref <117)

## 2014-04-26 NOTE — Patient Instructions (Signed)
Labs will be reviewed. Return in 6 months. Recommend diet exercise and weight loss.

## 2014-04-26 NOTE — Progress Notes (Signed)
   Subjective:    Patient ID: Sergio Sparks, male    DOB: 06-Nov-1975, 38 y.o.   MRN: [id number]  HPI  38 year old White Male in today for health maintenance exam and evaluation of medical problems. He was recently diagnosed with sleep apnea. His insurance would not pay for CPAP machine. He borrowed a family member's CPAP machine with setting of 13 which was recommended. He's doing well and feels better. He has a history of hypertension, obesity, glucose intolerance, hypertriglyceridemia, metabolic syndrome. Glucose intolerance is currently diet controlled.  Tetanus immunization update given 2014.  Social history: He is married. Has 2 children. Does not smoke. Wife is employed by [employer] [address]. Social alcohol consumption. He is now working mainly in [place].  Family history: Mother with history of obesity, hypertension, diabetes. Father with history of hypertension, hyperlipidemia, diabetes thyroid cancer status post thyroidectomy. One brother in good health. No sisters.    Review of Systems  Constitutional: Negative.   All other systems reviewed and are negative.      Objective:   Physical Exam  Constitutional: He is oriented to person, place, and time. He appears well-developed and well-nourished. No distress.  HENT:  Head: Normocephalic and atraumatic.  Right Ear: External ear normal.  Left Ear: External ear normal.  Mouth/Throat: Oropharynx is clear and moist. No oropharyngeal exudate.  Eyes: Conjunctivae are normal. Pupils are equal, round, and reactive to light. Right eye exhibits no discharge. Left eye exhibits no discharge. No scleral icterus.  Neck: Neck supple. No JVD present. No thyromegaly present.  Cardiovascular: Normal rate, regular rhythm and normal heart sounds.   No murmur heard. Pulmonary/Chest: Breath sounds normal. No respiratory distress. He has no wheezes. He has no rales.  Abdominal: Soft. Bowel sounds are normal. He exhibits no distension and no  mass. There is no tenderness. There is no rebound and no guarding.  Genitourinary:  No hernias to direct palpation. Testicles normal  Musculoskeletal: Normal range of motion. He exhibits no edema.  Lymphadenopathy:    He has no cervical adenopathy.  Neurological: He is alert and oriented to person, place, and time. He has normal reflexes. He displays normal reflexes. No cranial nerve deficit. Coordination normal.  Skin: Skin is warm and dry. No rash noted. He is not diaphoretic.  Psychiatric: He has a normal mood and affect. His behavior is normal. Judgment and thought content normal.  Vitals reviewed.         Assessment & Plan:  Obesity  Metabolic syndrome  Impaired glucose tolerance  Hypertriglyceridemia  Hypertension  Sleep apnea  Plan: Fasting lab work drawn and pending. Will be reviewed tomorrow. Further recommendations to follow if necessary. Otherwise return in 6 months. Recommend diet exercise and weight loss which would help sleep apnea and  medical conditions.

## 2014-10-05 ENCOUNTER — Other Ambulatory Visit: Payer: Self-pay | Admitting: Internal Medicine

## 2014-10-05 NOTE — Telephone Encounter (Signed)
Appt in June already made. Refill through June

## 2014-11-03 ENCOUNTER — Ambulatory Visit: Payer: BC Managed Care – PPO | Admitting: Internal Medicine

## 2014-11-10 ENCOUNTER — Encounter: Payer: Self-pay | Admitting: Internal Medicine

## 2014-11-10 ENCOUNTER — Other Ambulatory Visit: Payer: BLUE CROSS/BLUE SHIELD | Admitting: Internal Medicine

## 2014-11-10 ENCOUNTER — Ambulatory Visit (INDEPENDENT_AMBULATORY_CARE_PROVIDER_SITE_OTHER): Payer: BLUE CROSS/BLUE SHIELD | Admitting: Internal Medicine

## 2014-11-10 VITALS — BP 110/70 | HR 77 | Temp 98.0°F | Wt 233.0 lb

## 2014-11-10 DIAGNOSIS — E8881 Metabolic syndrome: Secondary | ICD-10-CM | POA: Diagnosis not present

## 2014-11-10 DIAGNOSIS — E669 Obesity, unspecified: Secondary | ICD-10-CM | POA: Diagnosis not present

## 2014-11-10 DIAGNOSIS — M722 Plantar fascial fibromatosis: Secondary | ICD-10-CM

## 2014-11-10 DIAGNOSIS — J069 Acute upper respiratory infection, unspecified: Secondary | ICD-10-CM | POA: Diagnosis not present

## 2014-11-10 DIAGNOSIS — I10 Essential (primary) hypertension: Secondary | ICD-10-CM | POA: Diagnosis not present

## 2014-11-10 DIAGNOSIS — G4733 Obstructive sleep apnea (adult) (pediatric): Secondary | ICD-10-CM | POA: Diagnosis not present

## 2014-11-10 DIAGNOSIS — E781 Pure hyperglyceridemia: Secondary | ICD-10-CM | POA: Diagnosis not present

## 2014-11-10 DIAGNOSIS — R7302 Impaired glucose tolerance (oral): Secondary | ICD-10-CM | POA: Diagnosis not present

## 2014-11-10 DIAGNOSIS — E786 Lipoprotein deficiency: Secondary | ICD-10-CM

## 2014-11-10 DIAGNOSIS — R7309 Other abnormal glucose: Secondary | ICD-10-CM

## 2014-11-10 DIAGNOSIS — J029 Acute pharyngitis, unspecified: Secondary | ICD-10-CM | POA: Diagnosis not present

## 2014-11-10 LAB — LIPID PANEL
CHOL/HDL RATIO: 4.1 ratio
Cholesterol: 107 mg/dL (ref 0–200)
HDL: 26 mg/dL — AB (ref >=40)
LDL CALC: 69 mg/dL (ref 0–99)
TRIGLYCERIDES: 61 mg/dL (ref <150)
VLDL: 12 mg/dL (ref 0–40)

## 2014-11-10 LAB — POCT RAPID STREP A (OFFICE): RAPID STREP A SCREEN: NEGATIVE

## 2014-11-10 MED ORDER — AZITHROMYCIN 250 MG PO TABS: ORAL_TABLET | ORAL | Status: DC

## 2014-11-10 NOTE — Progress Notes (Signed)
   Subjective:    Patient ID: Sergio Sparks, male    DOB: 08-20-1975, 39 y.o.   MRN: [id number]  HPI  For follow up of medical issues including hypertension, obesity, hypertriglyceridemia, impaired glucose tolerance metabolic syndrome, snoring and sleep apnea with C- pap apparatus. Also having issues with plantar fasciitis left foot. Exercises demonstrated to help with that.   Review of Systems Onset last week fever chills and sore throat     Objective:   Physical Exam Pharynx slightly injected without exudate. TMs are clear. Neck is supple without thyromegaly JVD or carotid bruits. No adenopathy. Chest clear to auscultation. Diabetic foot exam within normal limits. Remains obese       Assessment & Plan:  Essential hypertension-stable on medication  Obesity-encouraged diet exercise and weight loss  Hypertriglyceridemia-stable on triglyceride lowering medication- lipid panel shows normal LDL and total cholesterol and triglycerides  Low HDL cholesterol-low at 26  Impaired glucose tolerance-hemoglobin A1c stable at 6%  Snoring and sleep apnea-has C pap apparatus  Pharyngitis-rapid strep screen negative.  Plan: Zithromax Z-Pak take as directed for sore throat symptoms. Continue same medications and return in 6 months. Encouraged diet exercise and weight loss.

## 2014-11-10 NOTE — Patient Instructions (Addendum)
Labs pending. Take Z-pak as directed. Continue C- Pap. Diet exercise and weight loss suggested. Return in 6 months for physical exam

## 2014-11-11 LAB — MICROALBUMIN / CREATININE URINE RATIO
Creatinine, Urine: 207.5 mg/dL
MICROALB UR: 0.3 mg/dL (ref <2.0)
Microalb Creat Ratio: 1.4 mg/g (ref 0.0–30.0)

## 2014-11-11 LAB — HEMOGLOBIN A1C
HEMOGLOBIN A1C: 6 % — AB (ref <5.7)
MEAN PLASMA GLUCOSE: 126 mg/dL — AB (ref <117)

## 2014-11-27 DIAGNOSIS — E8881 Metabolic syndrome: Secondary | ICD-10-CM | POA: Insufficient documentation

## 2014-12-08 ENCOUNTER — Telehealth: Payer: Self-pay | Admitting: Internal Medicine

## 2014-12-08 NOTE — Telephone Encounter (Addendum)
Patient has new insurance and they will not cover his Fenofibrate - Tricor.  He wants to know if there is another one he can use.

## 2014-12-08 NOTE — Telephone Encounter (Signed)
Patient will call insurance company and will let us know what is covered for triglycerides

## 2014-12-08 NOTE — Telephone Encounter (Signed)
He will need to call his insurance company and see what is on the formulary for triglycerides and let us know.

## 2014-12-09 ENCOUNTER — Other Ambulatory Visit: Payer: Self-pay | Admitting: *Deleted

## 2014-12-09 MED ORDER — FENOFIBRATE 160 MG PO TABS: 160.0000 mg | ORAL_TABLET | Freq: Every day | ORAL | Status: DC

## 2014-12-09 NOTE — Telephone Encounter (Signed)
Tricor dosage changed due to insurance coverage per Dr Lenord FellersBaxley

## 2015-01-02 ENCOUNTER — Other Ambulatory Visit: Payer: Self-pay | Admitting: Internal Medicine

## 2015-05-05 ENCOUNTER — Encounter: Payer: BLUE CROSS/BLUE SHIELD | Admitting: Internal Medicine

## 2015-05-16 ENCOUNTER — Encounter: Payer: Self-pay | Admitting: Internal Medicine

## 2015-05-16 ENCOUNTER — Ambulatory Visit (INDEPENDENT_AMBULATORY_CARE_PROVIDER_SITE_OTHER): Payer: 59 | Admitting: Internal Medicine

## 2015-05-16 VITALS — BP 112/72 | HR 73 | Temp 97.7°F | Resp 20 | Ht 70.0 in | Wt 233.0 lb

## 2015-05-16 DIAGNOSIS — E669 Obesity, unspecified: Secondary | ICD-10-CM

## 2015-05-16 DIAGNOSIS — G4730 Sleep apnea, unspecified: Secondary | ICD-10-CM | POA: Diagnosis not present

## 2015-05-16 DIAGNOSIS — E786 Lipoprotein deficiency: Secondary | ICD-10-CM

## 2015-05-16 DIAGNOSIS — R7302 Impaired glucose tolerance (oral): Secondary | ICD-10-CM | POA: Diagnosis not present

## 2015-05-16 DIAGNOSIS — R748 Abnormal levels of other serum enzymes: Secondary | ICD-10-CM

## 2015-05-16 DIAGNOSIS — E781 Pure hyperglyceridemia: Secondary | ICD-10-CM | POA: Diagnosis not present

## 2015-05-16 DIAGNOSIS — E8881 Metabolic syndrome: Secondary | ICD-10-CM

## 2015-05-16 DIAGNOSIS — I10 Essential (primary) hypertension: Secondary | ICD-10-CM

## 2015-05-16 DIAGNOSIS — R7309 Other abnormal glucose: Secondary | ICD-10-CM

## 2015-05-16 DIAGNOSIS — Z0000 Encounter for general adult medical examination without abnormal findings: Secondary | ICD-10-CM

## 2015-05-16 LAB — CBC WITH DIFFERENTIAL/PLATELET
BASOS ABS: 0 10*3/uL (ref 0.0–0.1)
BASOS PCT: 0 % (ref 0–1)
Eosinophils Absolute: 0.2 10*3/uL (ref 0.0–0.7)
Eosinophils Relative: 3 % (ref 0–5)
HEMATOCRIT: 41.8 % (ref 39.0–52.0)
HEMOGLOBIN: 13.6 g/dL (ref 13.0–17.0)
LYMPHS PCT: 30 % (ref 12–46)
Lymphs Abs: 2 10*3/uL (ref 0.7–4.0)
MCH: 28 pg (ref 26.0–34.0)
MCHC: 32.5 g/dL (ref 30.0–36.0)
MCV: 86.2 fL (ref 78.0–100.0)
MPV: 9.1 fL (ref 8.6–12.4)
Monocytes Absolute: 0.4 10*3/uL (ref 0.1–1.0)
Monocytes Relative: 6 % (ref 3–12)
NEUTROS ABS: 4.1 10*3/uL (ref 1.7–7.7)
NEUTROS PCT: 61 % (ref 43–77)
Platelets: 390 10*3/uL (ref 150–400)
RBC: 4.85 MIL/uL (ref 4.22–5.81)
RDW: 13.1 % (ref 11.5–15.5)
WBC: 6.7 10*3/uL (ref 4.0–10.5)

## 2015-05-16 LAB — POCT URINALYSIS DIPSTICK
Bilirubin, UA: NEGATIVE
Blood, UA: NEGATIVE
Glucose, UA: NEGATIVE
KETONES UA: NEGATIVE
Leukocytes, UA: NEGATIVE
Nitrite, UA: NEGATIVE
PH UA: 6.5
PROTEIN UA: NEGATIVE
SPEC GRAV UA: 1.02
UROBILINOGEN UA: 0.2

## 2015-05-16 LAB — COMPLETE METABOLIC PANEL WITH GFR
ALBUMIN: 4.3 g/dL (ref 3.6–5.1)
ALK PHOS: 52 U/L (ref 40–115)
ALT: 30 U/L (ref 9–46)
AST: 21 U/L (ref 10–40)
BUN: 12 mg/dL (ref 7–25)
CALCIUM: 9.6 mg/dL (ref 8.6–10.3)
CO2: 26 mmol/L (ref 20–31)
CREATININE: 0.94 mg/dL (ref 0.60–1.35)
Chloride: 100 mmol/L (ref 98–110)
GFR, Est African American: >89 mL/min (ref >=60)
GFR, Est Non African American: >89 mL/min (ref >=60)
Glucose, Bld: 93 mg/dL (ref 65–99)
POTASSIUM: 4.5 mmol/L (ref 3.5–5.3)
SODIUM: 138 mmol/L (ref 135–146)
Total Bilirubin: 0.3 mg/dL (ref 0.2–1.2)
Total Protein: 7.1 g/dL (ref 6.1–8.1)

## 2015-05-16 LAB — LIPID PANEL
CHOL/HDL RATIO: 4.8 ratio (ref <=5.0)
Cholesterol: 154 mg/dL (ref 125–200)
HDL: 32 mg/dL — ABNORMAL LOW (ref >=40)
LDL Cholesterol: 103 mg/dL (ref <130)
Triglycerides: 94 mg/dL (ref <150)
VLDL: 19 mg/dL (ref <30)

## 2015-05-16 NOTE — Progress Notes (Signed)
   Subjective:    Patient ID: Sergio ReamerBrandon Sparks, male    DOB: 05-26-1976, 39 y.o.   MRN: [id number]  HPI 39 year old White Male in today for health maintenance exam and evaluation of medical issues. History of sleep apnea, hypertension, obesity, glucose intolerance, hypertriglyceridemia, metabolic syndrome. Glucose intolerance is currently diet control.  Tetanus immunization given 2014.  Social history: He is married. Has 2 children. Does not smoke. Wife is employed by [employer] of [address]. Social alcohol consumption. He doesn't do as much physical labor and walking as he used to. 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 Does wrote and ride horses on the weekends.  Family history: Mother with history of obesity, hypertension, diabetes. Father with history of hypertension, hyperlipidemia, diabetes, thyroid cancer status post thyroidectomy. One brother in good health. No sisters.    Review of Systems  Constitutional: Negative.   All other systems reviewed and are negative.      Objective:   Physical Exam  Constitutional: He is oriented to person, place, and time. He appears well-developed and well-nourished. No distress.  HENT:  Head: Normocephalic and atraumatic.  Right Ear: External ear normal.  Left Ear: External ear normal.  Mouth/Throat: Oropharynx is clear and moist. No oropharyngeal exudate.  Eyes: Conjunctivae and EOM are normal. Pupils are equal, round, and reactive to light. Right eye exhibits no discharge.  Neck: Neck supple. No JVD present. No thyromegaly present.  Cardiovascular: Normal rate, regular rhythm and normal heart sounds.   No murmur heard. Pulmonary/Chest: Breath sounds normal.  Abdominal: Bowel sounds are normal. He exhibits no distension and no mass. There is no tenderness. There is no rebound.  Genitourinary:  No hernias  Musculoskeletal: He exhibits no edema.  Lymphadenopathy:    He has no cervical adenopathy.  Neurological: He is alert  and oriented to person, place, and time. He has normal reflexes. No cranial nerve deficit.  Skin: Skin is warm and dry. No rash noted. He is not diaphoretic.  Psychiatric: He has a normal mood and affect. His behavior is normal. Judgment and thought content normal.  Vitals reviewed.         Assessment & Plan:  Obesity  Metabolic syndrome  Essential hypertension  Impaired glucose tolerance  Hypertriglyceridemia  Sleep apnea  Plan: Spent time talking with him about diet. Could try to get more exercise at work. Needs to watch caloric consumption. Return in 6 months or as needed.

## 2015-05-17 LAB — HEMOGLOBIN A1C
Hgb A1c MFr Bld: 6.1 % — ABNORMAL HIGH (ref <5.7)
Mean Plasma Glucose: 128 mg/dL — ABNORMAL HIGH (ref <117)

## 2015-05-17 LAB — MICROALBUMIN, URINE: Microalb, Ur: 0.2 mg/dL

## 2015-05-17 LAB — PSA: PSA: 0.14 ng/mL (ref <=4.00)

## 2015-06-01 ENCOUNTER — Other Ambulatory Visit: Payer: Self-pay

## 2015-06-01 MED ORDER — FENOFIBRATE 160 MG PO TABS: 160.0000 mg | ORAL_TABLET | Freq: Every day | ORAL | Status: DC

## 2015-06-01 MED ORDER — RAMIPRIL 10 MG PO CAPS: 10.0000 mg | ORAL_CAPSULE | Freq: Every day | ORAL | Status: DC

## 2015-06-03 DIAGNOSIS — E786 Lipoprotein deficiency: Secondary | ICD-10-CM | POA: Insufficient documentation

## 2015-06-03 NOTE — Patient Instructions (Signed)
Watch diet and try to exercise more. Return in 6 months.

## 2015-06-27 ENCOUNTER — Other Ambulatory Visit: Payer: Self-pay | Admitting: Internal Medicine

## 2015-10-25 ENCOUNTER — Other Ambulatory Visit: Payer: Self-pay | Admitting: Internal Medicine

## 2015-11-10 ENCOUNTER — Encounter: Payer: Self-pay | Admitting: Internal Medicine

## 2015-11-10 ENCOUNTER — Ambulatory Visit (INDEPENDENT_AMBULATORY_CARE_PROVIDER_SITE_OTHER): Payer: 59 | Admitting: Internal Medicine

## 2015-11-10 VITALS — BP 124/76 | HR 71 | Temp 98.4°F | Resp 18 | Wt 228.0 lb

## 2015-11-10 DIAGNOSIS — Z79899 Other long term (current) drug therapy: Secondary | ICD-10-CM

## 2015-11-10 DIAGNOSIS — E785 Hyperlipidemia, unspecified: Secondary | ICD-10-CM

## 2015-11-10 DIAGNOSIS — E8881 Metabolic syndrome: Secondary | ICD-10-CM

## 2015-11-10 DIAGNOSIS — J0100 Acute maxillary sinusitis, unspecified: Secondary | ICD-10-CM

## 2015-11-10 DIAGNOSIS — E119 Type 2 diabetes mellitus without complications: Secondary | ICD-10-CM

## 2015-11-10 DIAGNOSIS — E669 Obesity, unspecified: Secondary | ICD-10-CM

## 2015-11-10 DIAGNOSIS — I10 Essential (primary) hypertension: Secondary | ICD-10-CM

## 2015-11-10 LAB — HEPATIC FUNCTION PANEL
ALBUMIN: 4.4 g/dL (ref 3.6–5.1)
ALK PHOS: 61 U/L (ref 40–115)
ALT: 22 U/L (ref 9–46)
AST: 16 U/L (ref 10–40)
Bilirubin, Direct: 0.1 mg/dL (ref <=0.2)
Indirect Bilirubin: 0.3 mg/dL (ref 0.2–1.2)
TOTAL PROTEIN: 7.2 g/dL (ref 6.1–8.1)
Total Bilirubin: 0.4 mg/dL (ref 0.2–1.2)

## 2015-11-10 LAB — LIPID PANEL
CHOL/HDL RATIO: 4 ratio (ref <=5.0)
Cholesterol: 143 mg/dL (ref 125–200)
HDL: 36 mg/dL — AB (ref >=40)
LDL CALC: 90 mg/dL (ref <130)
TRIGLYCERIDES: 84 mg/dL (ref <150)
VLDL: 17 mg/dL (ref <30)

## 2015-11-10 LAB — HEMOGLOBIN A1C
HEMOGLOBIN A1C: 5.7 % — AB (ref <5.7)
Mean Plasma Glucose: 117 mg/dL

## 2015-11-10 MED ORDER — RAMIPRIL 10 MG PO CAPS: ORAL_CAPSULE | ORAL | Status: DC

## 2015-11-10 MED ORDER — AMOXICILLIN 500 MG PO CAPS: 500.0000 mg | ORAL_CAPSULE | Freq: Three times a day (TID) | ORAL | Status: DC

## 2015-11-10 MED ORDER — FENOFIBRATE 160 MG PO TABS: ORAL_TABLET | ORAL | Status: DC

## 2015-11-10 NOTE — Progress Notes (Signed)
   Subjective:    Patient ID: Sergio Sparks, male    DOB: December 19, 1975, 40 y.o.   MRN: [id number]  HPI  40 year old white male in today for six-month recheck on impaired glucose tolerance, essential hypertension, hypertriglyceridemia, obesity, metabolic syndrome. Blood pressures under excellent control. 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 Doesn't get a lot of exercise. Has lost 5 pounds since December.  For couple of months he's had what he describes as a sinus infection with nasal congestion, some discolored nasal drainage and sore throat. Symptoms are persistent but have improved.    Review of Systems see above     Objective:   Physical Exam  Skin warm and dry. Pharynx slightly injected. TMs are clear. Neck is supple without JVD thyromegaly or cervical adenopathy. Chest clear to auscultation without rales or wheezing. Extremities without edema.      Assessment & Plan:  Acute maxillary sinusitis  Essential hypertension  Impaired glucose tolerance  Obesity  Metabolic syndrome  Hypertriglyceridemia  Plan: Fasting lipid panel and hemoglobin A1c drawn and pending. For sinusitis amoxicillin 500 mg 3 times daily for 10 days. Return in 6 months for physical exam. Continued work on diet and exercise.

## 2015-11-10 NOTE — Patient Instructions (Signed)
Amoxicillin 500 mg 3 times daily for 10 days for sinusitis. Continue same medications. Fasting labs drawn and pending. Recommend annual eye exam. Return in 6 months. Continue to work on diet and exercise.

## 2015-11-11 LAB — MICROALBUMIN, URINE: Microalb, Ur: 0.3 mg/dL

## 2016-01-03 ENCOUNTER — Other Ambulatory Visit: Payer: Self-pay | Admitting: Internal Medicine

## 2016-03-15 ENCOUNTER — Other Ambulatory Visit: Payer: Self-pay

## 2016-03-15 MED ORDER — RAMIPRIL 10 MG PO CAPS: 10.0000 mg | ORAL_CAPSULE | Freq: Every day | ORAL | 3 refills | Status: DC

## 2016-03-15 MED ORDER — FENOFIBRATE 160 MG PO TABS: ORAL_TABLET | ORAL | 3 refills | Status: DC

## 2016-05-17 ENCOUNTER — Ambulatory Visit (INDEPENDENT_AMBULATORY_CARE_PROVIDER_SITE_OTHER): Payer: Managed Care, Other (non HMO) | Admitting: Internal Medicine

## 2016-05-17 ENCOUNTER — Encounter: Payer: Self-pay | Admitting: Internal Medicine

## 2016-05-17 VITALS — BP 122/80 | HR 87 | Temp 98.1°F | Ht 70.0 in | Wt 233.0 lb

## 2016-05-17 DIAGNOSIS — I10 Essential (primary) hypertension: Secondary | ICD-10-CM

## 2016-05-17 DIAGNOSIS — E8881 Metabolic syndrome: Secondary | ICD-10-CM | POA: Diagnosis not present

## 2016-05-17 DIAGNOSIS — R7302 Impaired glucose tolerance (oral): Secondary | ICD-10-CM | POA: Diagnosis not present

## 2016-05-17 DIAGNOSIS — E781 Pure hyperglyceridemia: Secondary | ICD-10-CM | POA: Diagnosis not present

## 2016-05-17 DIAGNOSIS — Z23 Encounter for immunization: Secondary | ICD-10-CM

## 2016-05-17 DIAGNOSIS — Z6833 Body mass index (BMI) 33.0-33.9, adult: Secondary | ICD-10-CM

## 2016-05-17 DIAGNOSIS — Z0000 Encounter for general adult medical examination without abnormal findings: Secondary | ICD-10-CM

## 2016-05-17 DIAGNOSIS — G4733 Obstructive sleep apnea (adult) (pediatric): Secondary | ICD-10-CM | POA: Diagnosis not present

## 2016-05-17 LAB — COMPLETE METABOLIC PANEL WITH GFR
ALBUMIN: 4.4 g/dL (ref 3.6–5.1)
ALK PHOS: 57 U/L (ref 40–115)
ALT: 31 U/L (ref 9–46)
AST: 21 U/L (ref 10–40)
BILIRUBIN TOTAL: 0.4 mg/dL (ref 0.2–1.2)
BUN: 17 mg/dL (ref 7–25)
CALCIUM: 9.5 mg/dL (ref 8.6–10.3)
CO2: 23 mmol/L (ref 20–31)
Chloride: 105 mmol/L (ref 98–110)
Creat: 0.98 mg/dL (ref 0.60–1.35)
GFR, Est Non African American: >89 mL/min (ref >=60)
Glucose, Bld: 91 mg/dL (ref 65–99)
Potassium: 4.5 mmol/L (ref 3.5–5.3)
Sodium: 140 mmol/L (ref 135–146)
TOTAL PROTEIN: 7.1 g/dL (ref 6.1–8.1)

## 2016-05-17 LAB — CBC WITH DIFFERENTIAL/PLATELET
BASOS PCT: 0 %
Basophils Absolute: 0 cells/uL (ref 0–200)
EOS ABS: 240 {cells}/uL (ref 15–500)
Eosinophils Relative: 3 %
HEMATOCRIT: 43.6 % (ref 38.5–50.0)
HEMOGLOBIN: 14.2 g/dL (ref 13.2–17.1)
LYMPHS ABS: 2160 {cells}/uL (ref 850–3900)
LYMPHS PCT: 27 %
MCH: 29 pg (ref 27.0–33.0)
MCHC: 32.6 g/dL (ref 32.0–36.0)
MCV: 89 fL (ref 80.0–100.0)
MONO ABS: 480 {cells}/uL (ref 200–950)
MPV: 8.9 fL (ref 7.5–12.5)
Monocytes Relative: 6 %
Neutro Abs: 5120 cells/uL (ref 1500–7800)
Neutrophils Relative %: 64 %
Platelets: 399 10*3/uL (ref 140–400)
RBC: 4.9 MIL/uL (ref 4.20–5.80)
RDW: 13.3 % (ref 11.0–15.0)
WBC: 8 10*3/uL (ref 3.8–10.8)

## 2016-05-17 LAB — LIPID PANEL
CHOLESTEROL: 171 mg/dL (ref <200)
HDL: 37 mg/dL — ABNORMAL LOW (ref >40)
LDL Cholesterol: 116 mg/dL — ABNORMAL HIGH (ref <100)
TRIGLYCERIDES: 90 mg/dL (ref <150)
Total CHOL/HDL Ratio: 4.6 Ratio (ref <5.0)
VLDL: 18 mg/dL (ref <30)

## 2016-05-17 LAB — HEMOGLOBIN A1C
Hgb A1c MFr Bld: 5.6 % (ref <5.7)
MEAN PLASMA GLUCOSE: 114 mg/dL

## 2016-05-17 NOTE — Progress Notes (Signed)
   Subjective:    Patient ID: Clarene ReamerBrandon Kamath, male    DOB: 28-Mar-1976, 40 y.o.   MRN: [id number]  HPI  40 year old Male for health maintenance exam and evaluation of medical issues.  History of impaired glucose tolerance, sleep apnea, hypertension, obesity, hypertriglyceridemia, metabolic syndrome.  Tetanus immunization given 2014.  Social history: He is married he has 2 children. Does not smoke. Wife is employed by [employer] of [address]. Social alcohol consumption. 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 He does ride horses on the weekends.  Family history: Mother with history of obesity, hypertension, diabetes. Father with history of hypertension, hyperlipidemia, diabetes, thyroid cancer status post thyroidectomy. One brother in good health. No sisters.      Review of Systems no new complaints      Objective:   Physical Exam  Constitutional: He appears well-developed and well-nourished. No distress.  HENT:  Head: Normocephalic and atraumatic.  Right Ear: External ear normal.  Left Ear: External ear normal.  Mouth/Throat: Oropharynx is clear and moist. No oropharyngeal exudate.  Eyes: Conjunctivae and EOM are normal. Pupils are equal, round, and reactive to light.  Neck: Neck supple. No JVD present. No tracheal deviation present. No thyromegaly present.  Cardiovascular: Normal rate, regular rhythm and normal heart sounds.   No murmur heard. Pulmonary/Chest: Effort normal and breath sounds normal.  Lymphadenopathy:    He has no cervical adenopathy.  Skin: He is not diaphoretic.  Vitals reviewed.         Assessment & Plan:  Essential hypertension-stable on current regimen  Impaired glucose tolerance-11 A1c is 5.6% in 6 months ago was 5.7%. This is excellent  Hyperlipidemia-hypertriglyceridemia on medication. 5 years ago, triglycerides were 351 before starting medication.  Obesity-encouraged diet exercise and weight  loss  Metabolic syndrome  Sleep apnea- has C pap apparatus  Plan: Continue same medications and return in 6 months. Encouraged diet exercise and weight loss.

## 2016-05-18 LAB — MICROALBUMIN, URINE: MICROALB UR: 0.2 mg/dL

## 2016-05-20 ENCOUNTER — Telehealth: Payer: Self-pay | Admitting: Internal Medicine

## 2016-05-20 LAB — POCT URINALYSIS DIPSTICK
Bilirubin, UA: NEGATIVE
Glucose, UA: NEGATIVE
Ketones, UA: NEGATIVE
Leukocytes, UA: NEGATIVE
NITRITE UA: NEGATIVE
PROTEIN UA: NEGATIVE
RBC UA: NEGATIVE
SPEC GRAV UA: 1.01
UROBILINOGEN UA: NEGATIVE
pH, UA: 6

## 2016-05-20 NOTE — Telephone Encounter (Signed)
Left message informing patient the reaction is local and should resolve itself.

## 2016-05-20 NOTE — Telephone Encounter (Signed)
Patient called complaining about feeling bad.  He says that he received the pneumonia vaccine Friday and by that evening he was running a low grade fever and his arm was sore.  He says what ever it was "hit him like a ton of bricks".  He wanted to know if this was a normal side effect from the injection.  He says the site was warm to touch as well.  Please advise.

## 2016-05-20 NOTE — Telephone Encounter (Signed)
A local reaction that will resolve.

## 2016-06-02 ENCOUNTER — Encounter: Payer: Self-pay | Admitting: Internal Medicine

## 2016-06-02 NOTE — Patient Instructions (Signed)
It was pleasure to see today. Continue diet exercise and weight loss efforts. Continue same medications and return in 6 months.

## 2016-11-15 ENCOUNTER — Encounter: Payer: Self-pay | Admitting: Internal Medicine

## 2016-11-15 ENCOUNTER — Ambulatory Visit (INDEPENDENT_AMBULATORY_CARE_PROVIDER_SITE_OTHER): Payer: Managed Care, Other (non HMO) | Admitting: Internal Medicine

## 2016-11-15 VITALS — BP 120/64 | HR 61 | Temp 97.2°F | Wt 224.0 lb

## 2016-11-15 DIAGNOSIS — G4733 Obstructive sleep apnea (adult) (pediatric): Secondary | ICD-10-CM

## 2016-11-15 DIAGNOSIS — E8881 Metabolic syndrome: Secondary | ICD-10-CM

## 2016-11-15 DIAGNOSIS — E781 Pure hyperglyceridemia: Secondary | ICD-10-CM | POA: Diagnosis not present

## 2016-11-15 DIAGNOSIS — E669 Obesity, unspecified: Secondary | ICD-10-CM | POA: Diagnosis not present

## 2016-11-15 DIAGNOSIS — R7302 Impaired glucose tolerance (oral): Secondary | ICD-10-CM

## 2016-11-15 DIAGNOSIS — J0100 Acute maxillary sinusitis, unspecified: Secondary | ICD-10-CM | POA: Diagnosis not present

## 2016-11-15 DIAGNOSIS — I10 Essential (primary) hypertension: Secondary | ICD-10-CM

## 2016-11-15 LAB — LIPID PANEL
CHOL/HDL RATIO: 3.6 ratio (ref <5.0)
Cholesterol: 118 mg/dL (ref <200)
HDL: 33 mg/dL — ABNORMAL LOW (ref >40)
LDL Cholesterol: 71 mg/dL (ref <100)
Triglycerides: 72 mg/dL (ref <150)
VLDL: 14 mg/dL (ref <30)

## 2016-11-15 MED ORDER — AZITHROMYCIN 250 MG PO TABS: ORAL_TABLET | ORAL | 0 refills | Status: DC

## 2016-11-15 NOTE — Progress Notes (Signed)
   Subjective:    Patient ID: Sergio Sparks, male    DOB: 1975/06/28, 41 y.o.   MRN: [id number]  HPI  41 year old Male for 6 month recheck on obesity, impaired glucose tolerance, hypertension, hypertriglyceridemia, sleep apnea.  Weighs 224 lbs today and was 233 lbs at last visit- 9 pound weight loss. Had  Triglycerides of 351 in 2012. Now normal on medication.   Patient had local reaction to Prevnar 13 and declines Pneumovax 23.   Smokes on occasion but not very often. Occasionally will smoke if having a beer.  Having sinusitis symptoms. Nasal congestion but no fever or chills. Present for at least a couple of weeks. Would like antibiotic.  Review of Systems see above     Objective:   Physical Exam TMs are clear. Pharynx is clear. Sounds a bit nasally congested. Neck supple. Chest clear to auscultation. Cardiac exam regular rate and rhythm normal S1 and S2  Extremities without edema. See diabetic foot exam.       Assessment & Plan:  Maxillary sinusitis-Zithromax Z-PAK prescribed take 2 tablets day one followed by 1 tablet days 2 through 5.  Obesity-patient advised to continue diet exercise and weight loss efforts. Would like weight to be less than 200 pounds.  Hypertriglyceridemia-labs pending  Impaired glucose tolerance-labs pending  Essential hypertension-controlled with medication  Plan: Continue same medications, continue diet and exercise efforts and return in 6 months for physical examination.

## 2016-11-15 NOTE — Patient Instructions (Signed)
Labs are pending. Continue diet exercise and weight loss efforts and return in 6 months. For maxillary sinusitis, take Zithromax Z-PAK as directed

## 2016-11-16 LAB — MICROALBUMIN / CREATININE URINE RATIO: CREATININE, URINE: 164 mg/dL (ref 20–370)

## 2016-11-16 LAB — HEMOGLOBIN A1C
HEMOGLOBIN A1C: 5.5 % (ref <5.7)
MEAN PLASMA GLUCOSE: 111 mg/dL

## 2017-02-19 ENCOUNTER — Telehealth: Payer: Self-pay

## 2017-02-19 MED ORDER — FENOFIBRATE 160 MG PO TABS: ORAL_TABLET | ORAL | 0 refills | Status: DC

## 2017-02-19 MED ORDER — RAMIPRIL 10 MG PO CAPS: 10.0000 mg | ORAL_CAPSULE | Freq: Every day | ORAL | 0 refills | Status: DC

## 2017-02-19 NOTE — Telephone Encounter (Signed)
Pt called and stated that he needed one month supply of his medication sent to [facility] where he is working. One month supply sent appt is in December for his CPE.

## 2017-03-15 ENCOUNTER — Other Ambulatory Visit: Payer: Self-pay | Admitting: Internal Medicine

## 2017-04-15 ENCOUNTER — Other Ambulatory Visit: Payer: Self-pay | Admitting: Internal Medicine

## 2017-04-15 NOTE — Telephone Encounter (Signed)
Refill x 90 days 

## 2017-04-15 NOTE — Telephone Encounter (Signed)
Will he be able to keep appt December 21st? Heard he had moved to [place]. Please advise.

## 2017-04-15 NOTE — Telephone Encounter (Signed)
No he will not be able to make the appointment and he said that he still comes home regularly so he does not want to switch doctors at this time. He had to cancel his appointment because he does not land until the 22nd of December. Stated he will call back to reschedule once he gets his new calendar. Please advise on refills.

## 2017-05-23 ENCOUNTER — Encounter: Payer: Managed Care, Other (non HMO) | Admitting: Internal Medicine

## 2017-06-09 ENCOUNTER — Other Ambulatory Visit: Payer: Self-pay | Admitting: Internal Medicine

## 2017-06-20 ENCOUNTER — Other Ambulatory Visit: Payer: Self-pay | Admitting: Internal Medicine

## 2019-06-18 ENCOUNTER — Telehealth: Payer: Self-pay | Admitting: Internal Medicine

## 2019-06-18 NOTE — Telephone Encounter (Signed)
Scheduled appt for Monday, he has an eye appointment next week at [facility]

## 2019-06-18 NOTE — Telephone Encounter (Signed)
Sergio Sparks [phone]  Thorn called to say he is having double vision, he has an appointment with eye doctor, but they said they do not treat double vision.

## 2019-06-18 NOTE — Telephone Encounter (Signed)
Please call him. We have not seen him in over 2 years. Is he still taking BP meds. Did he call optometrist or opthalmologist? We can try to work him in soon. How long has this been going on?

## 2019-06-21 ENCOUNTER — Other Ambulatory Visit: Payer: Self-pay

## 2019-06-21 ENCOUNTER — Encounter: Payer: Self-pay | Admitting: Internal Medicine

## 2019-06-21 ENCOUNTER — Ambulatory Visit: Payer: Managed Care, Other (non HMO) | Admitting: Internal Medicine

## 2019-06-21 VITALS — BP 110/80 | HR 74 | Temp 98.0°F | Ht 70.0 in | Wt 223.0 lb

## 2019-06-21 DIAGNOSIS — Z0000 Encounter for general adult medical examination without abnormal findings: Secondary | ICD-10-CM

## 2019-06-21 DIAGNOSIS — H532 Diplopia: Secondary | ICD-10-CM | POA: Diagnosis not present

## 2019-06-21 DIAGNOSIS — R7302 Impaired glucose tolerance (oral): Secondary | ICD-10-CM | POA: Diagnosis not present

## 2019-06-21 DIAGNOSIS — Z13220 Encounter for screening for lipoid disorders: Secondary | ICD-10-CM

## 2019-06-21 DIAGNOSIS — I10 Essential (primary) hypertension: Secondary | ICD-10-CM

## 2019-06-21 DIAGNOSIS — E781 Pure hyperglyceridemia: Secondary | ICD-10-CM | POA: Diagnosis not present

## 2019-06-21 NOTE — Progress Notes (Signed)
   Subjective:    Patient ID: Sergio Sparks, male    DOB: 03/29/1976, 44 y.o.   MRN: [id number]  HPI 44 year old Male not seen here since June 2018. He used to work in [place] but has been relocated to the [place] area. His home is in [place], [place] about 30 miles from [place]. He has a new baby, a daughter one week old.  He has a history of impaired glucose tolerance,sleep apnea, hypertension, hypertriglyceridemia and metabolic syndrome. Says he came off lipid lowering med as he lost weight.  Fasting labs drawn today and are pending.  New issues since apprx. mid December is vertical double vision. He has upcoming ophthalmology appt with [facility] here in [place]. It is in both eyes to about the same degree. It improves if he tilts his head backward a bit. Onset was sudden. No history of head trauma. Never had this before. He has been reading on the internet and is concerned there is a neurological problem.    Review of Systems negative nausea vomiting or vertigo.     Objective:   Physical Exam Blood pressure 110/80 BMI 32.00, pulse 74, pulse oximetry 98% weight 223 pounds  Skin warm and dry.  Nodes none.  PERRLA.  Extraocular movements are full.  No nystagmus is demonstrated.  TMs are clear.  He is alert and oriented x3.  His affect is normal.  His chest is clear to auscultation without rales or wheezing.  No carotid bruits.  No adenopathy.  No thyromegaly.  Cardiac exam regular rate and rhythm normal S1 and S2 without ectopy.  No lower extremity edema.  Gait is normal.       Assessment & Plan:  New onset of vertical double vision-needs neurological evaluation.  He also has made an appointment to be seen at [facility] in the near future and for vision check.  He will likely need MRI of the brain to rule out space-occupying lesion.  We have drawn fasting labs today with history of hypertension and hyperlipidemia as well as impaired glucose tolerance.  His  hemoglobin A1c is 5.8%.  Sed rate is 6.  ANA is negative.  Lipid panel shows HDL of 34 and an LDL of 117.  He no longer takes lipid-lowering medication.  He is just on ramipril 10 mg daily.

## 2019-06-22 ENCOUNTER — Encounter: Payer: Self-pay | Admitting: Neurology

## 2019-06-22 ENCOUNTER — Ambulatory Visit: Payer: Managed Care, Other (non HMO) | Admitting: Neurology

## 2019-06-22 ENCOUNTER — Other Ambulatory Visit: Payer: Self-pay

## 2019-06-22 VITALS — BP 135/77 | HR 77 | Temp 97.0°F | Ht 71.0 in | Wt 230.0 lb

## 2019-06-22 DIAGNOSIS — H532 Diplopia: Secondary | ICD-10-CM

## 2019-06-22 LAB — CBC WITH DIFFERENTIAL/PLATELET
Absolute Monocytes: 395 cells/uL (ref 200–950)
Basophils Absolute: 18 cells/uL (ref 0–200)
Basophils Relative: 0.3 %
Eosinophils Absolute: 100 cells/uL (ref 15–500)
Eosinophils Relative: 1.7 %
HCT: 43.7 % (ref 38.5–50.0)
Hemoglobin: 14.5 g/dL (ref 13.2–17.1)
Lymphs Abs: 1835 cells/uL (ref 850–3900)
MCH: 28.4 pg (ref 27.0–33.0)
MCHC: 33.2 g/dL (ref 32.0–36.0)
MCV: 85.7 fL (ref 80.0–100.0)
MPV: 9.3 fL (ref 7.5–12.5)
Monocytes Relative: 6.7 %
Neutro Abs: 3552 cells/uL (ref 1500–7800)
Neutrophils Relative %: 60.2 %
Platelets: 321 10*3/uL (ref 140–400)
RBC: 5.1 10*6/uL (ref 4.20–5.80)
RDW: 12.1 % (ref 11.0–15.0)
Total Lymphocyte: 31.1 %
WBC: 5.9 10*3/uL (ref 3.8–10.8)

## 2019-06-22 LAB — COMPLETE METABOLIC PANEL WITH GFR
AG Ratio: 1.7 (calc) (ref 1.0–2.5)
ALT: 49 U/L — ABNORMAL HIGH (ref 9–46)
AST: 22 U/L (ref 10–40)
Albumin: 4.5 g/dL (ref 3.6–5.1)
Alkaline phosphatase (APISO): 81 U/L (ref 36–130)
BUN: 11 mg/dL (ref 7–25)
CO2: 28 mmol/L (ref 20–32)
Calcium: 9.5 mg/dL (ref 8.6–10.3)
Chloride: 104 mmol/L (ref 98–110)
Creat: 0.72 mg/dL (ref 0.60–1.35)
GFR, Est African American: 131 mL/min/{1.73_m2} (ref >=60)
GFR, Est Non African American: 113 mL/min/{1.73_m2} (ref >=60)
Globulin: 2.7 g/dL (calc) (ref 1.9–3.7)
Glucose, Bld: 101 mg/dL — ABNORMAL HIGH (ref 65–99)
Potassium: 4.7 mmol/L (ref 3.5–5.3)
Sodium: 139 mmol/L (ref 135–146)
Total Bilirubin: 0.4 mg/dL (ref 0.2–1.2)
Total Protein: 7.2 g/dL (ref 6.1–8.1)

## 2019-06-22 LAB — LIPID PANEL
Cholesterol: 174 mg/dL (ref <200)
HDL: 34 mg/dL — ABNORMAL LOW (ref >=40)
LDL Cholesterol (Calc): 117 mg/dL (calc) — ABNORMAL HIGH
Non-HDL Cholesterol (Calc): 140 mg/dL (calc) — ABNORMAL HIGH (ref <130)
Total CHOL/HDL Ratio: 5.1 (calc) — ABNORMAL HIGH (ref <5.0)
Triglycerides: 122 mg/dL (ref <150)

## 2019-06-22 LAB — HEMOGLOBIN A1C
Hgb A1c MFr Bld: 5.8 % of total Hgb — ABNORMAL HIGH (ref <5.7)
Mean Plasma Glucose: 120 (calc)
eAG (mmol/L): 6.6 (calc)

## 2019-06-22 LAB — SEDIMENTATION RATE: Sed Rate: 6 mm/h (ref 0–15)

## 2019-06-22 LAB — ANA: Anti Nuclear Antibody (ANA): NEGATIVE

## 2019-06-22 NOTE — Progress Notes (Signed)
NEUROLOGY CONSULTATION NOTE  Sergio Sparks MRN: [id number] DOB: 06-26-1975  Referring provider: Tedra Senegal, MD Primary care provider: Tedra Senegal, MD  Reason for consult:  Vertical diplopia  HISTORY OF PRESENT ILLNESS: Sergio Sparks is a 44 year old right-handed white male who presents for vertical diplopia.  History supplemented by referring provider note.  About 3 weeks ago, he started getting vertical diplopia.  He denies prior history or preceding head trauma.  He doesn't remember if it was sudden or gradual onset.  When he gets up in the morning, he sees fine.  Then he starts getting the double vision in the afternoon, about 4 or 5 hours later.  This continues until he goes to sleep.  Today, however, he does not have double vision.  The double vision resolves if he closes either eye or if he looks forward with his head tilt back.  When he checks either eye, he notes that his image is elevated in his right eye compared to his left eye.  If he wakes up in the middle of the night, he sees fine.  There doesn't seem to be any change when standing in a hot shower or outside in the cold.  He denies headache, slurred speech, difficulty swallowing, dyspnea, weakness of extremities, difficulty keeping his head up, or numbness.  He denies family history of neurologic disease.  Labs obtained yesterday include sed rate 6; Hgb A1c 5.8; CBC with WBC 5.9, HGB 14.5, HCT 43.7, PLT 321; CMP with Na 139, K 4.7, Cl 104, CO2 28, glucose 101, BUN 11, Cr 0.72, t bili 0.4, ALP 81, AST 22, ALT 49.  PAST MEDICAL HISTORY: History reviewed. No pertinent past medical history.  PAST SURGICAL HISTORY: History reviewed. No pertinent surgical history.  MEDICATIONS: Current Outpatient Medications on File Prior to Visit  Medication Sig Dispense Refill  . ramipril (ALTACE) 10 MG capsule TAKE 1 CAPSULE(10 MG) BY MOUTH DAILY 90 capsule 0   No current facility-administered medications on file prior to visit.     ALLERGIES: No Known Allergies  FAMILY HISTORY: Family History  Problem Relation Age of Onset  . Diabetes Mother   . Hypertension Mother   . Diabetes Father   . Hypertension Father    SOCIAL HISTORY: Social History   Socioeconomic History  . Marital status: Married    Spouse name: Not on file  . Number of children: Not on file  . Years of education: Not on file  . Highest education level: Not on file  Occupational History  . Not on file  Tobacco Use  . Smoking status: Former Smoker    Packs/day: 0.20    Types: Cigarettes    Quit date: 01/25/2012    Years since quitting: 7.4  . Smokeless tobacco: Former Systems developer    Types: Chew  Substance and Sexual Activity  . Alcohol use: Yes    Alcohol/week: 0.0 standard drinks    Comment: socially  . Drug use: No  . Sexual activity: Not on file  Other Topics Concern  . Not on file  Social History Narrative  . Not on file   Social Determinants of Health   Financial Resource Strain:   . Difficulty of Paying Living Expenses: Not on file  Food Insecurity:   . Worried About Charity fundraiser in the Last Year: Not on file  . Ran Out of Food in the Last Year: Not on file  Transportation Needs:   . Lack of Transportation (Medical): Not on file  .  Lack of Transportation (Non-Medical): Not on file  Physical Activity:   . Days of Exercise per Week: Not on file  . Minutes of Exercise per Session: Not on file  Stress:   . Feeling of Stress : Not on file  Social Connections:   . Frequency of Communication with Friends and Family: Not on file  . Frequency of Social Gatherings with Friends and Family: Not on file  . Attends Religious Services: Not on file  . Active Member of Clubs or Organizations: Not on file  . Attends Banker Meetings: Not on file  . Marital Status: Not on file  Intimate Partner Violence:   . Fear of Current or Ex-Partner: Not on file  . Emotionally Abused: Not on file  . Physically Abused: Not on  file  . Sexually Abused: Not on file    REVIEW OF SYSTEMS: Constitutional: No fevers, chills, or sweats, no generalized fatigue, change in appetite Eyes: No visual changes, double vision, eye pain Ear, nose and throat: No hearing loss, ear pain, nasal congestion, sore throat Cardiovascular: No chest pain, palpitations Respiratory:  No shortness of breath at rest or with exertion, wheezes GastrointestinaI: No nausea, vomiting, diarrhea, abdominal pain, fecal incontinence Genitourinary:  No dysuria, urinary retention or frequency Musculoskeletal:  No neck pain, back pain Integumentary: No rash, pruritus, skin lesions Neurological: as above Psychiatric: No depression, insomnia, anxiety Endocrine: No palpitations, fatigue, diaphoresis, mood swings, change in appetite, change in weight, increased thirst Hematologic/Lymphatic:  No purpura, petechiae. Allergic/Immunologic: no itchy/runny eyes, nasal congestion, recent allergic reactions, rashes  PHYSICAL EXAM: Blood pressure 135/77, pulse 77, temperature (!) 97 F (36.1 C), height 5\' 11"  (1.803 m), weight 230 lb (104.3 kg), SpO2 98 %. General: No acute distress.  Patient appears well-groomed.  Head:  Normocephalic/atraumatic Eyes:  fundi examined but not visualized Neck: supple, no paraspinal tenderness, full range of motion Back: No paraspinal tenderness Heart: regular rate and rhythm Lungs: Clear to auscultation bilaterally. Vascular: No carotid bruits. Neurological Exam: Mental status: alert and oriented to person, place, and time, recent and remote memory intact, fund of knowledge intact, attention and concentration intact, speech fluent and not dysarthric, language intact. Cranial nerves: CN I: not tested CN II: pupils equal, round and reactive to light, visual fields intact CN III, IV, VI:  full range of motion, no nystagmus, no ptosis CN V: facial sensation intact CN VII: upper and lower face symmetric CN VIII: hearing  intact CN IX, X: gag intact, uvula midline CN XI: sternocleidomastoid and trapezius muscles intact CN XII: tongue midline Bulk & Tone: normal, no fasciculations. Motor:  5/5 throughout  Sensation:  Pinprick and vibration sensation intact. Deep Tendon Reflexes:  2+ throughout, toes downgoing.  Finger to nose testing:  Without dysmetria.  Heel to shin:  Without dysmetria.  Gait:  Normal station and stride.  Able to turn and tandem walk. Romberg negative.  IMPRESSION: Vertical diplopia.  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  I would still image the brain and orbits to rule out secondary intracranial abnormalities.  PLAN: 1.  MRI of brain and orbits with and without contrast 2.  Check TSH and Myasthenia gravis panel 3.  He has an eye exam at [facility] next week. 4.  Further recommendations pending results.  Thank you for allowing me to take part in the care of this patient.  [facility], DO  CC: Shon Millet, MD

## 2019-06-22 NOTE — Patient Instructions (Signed)
1.  We will check blood work:  TSH, Myasthenia gravis panel 2.  We will check MRI of brain and orbits with and without contrast 3.  Further recommendations pending results.

## 2019-06-22 NOTE — Patient Instructions (Signed)
It was a pleasure to see you today. We will ask for neurology consultation as soon as possible. Labs drawn and pending. Careful with driving.

## 2019-06-22 NOTE — Progress Notes (Signed)
Many thanks for working this patient in so quickly

## 2019-06-24 ENCOUNTER — Other Ambulatory Visit (INDEPENDENT_AMBULATORY_CARE_PROVIDER_SITE_OTHER): Payer: Managed Care, Other (non HMO)

## 2019-06-24 ENCOUNTER — Other Ambulatory Visit: Payer: Self-pay

## 2019-06-24 DIAGNOSIS — H532 Diplopia: Secondary | ICD-10-CM

## 2019-06-24 NOTE — Telephone Encounter (Signed)
Called Sergio Sparks to see what eye doctor said and that appointment is not until 06/28/2019, I did ask him to have them fax the report to [place]. He is going to have some labs done today for Dr Everlena Cooper.

## 2019-06-28 ENCOUNTER — Other Ambulatory Visit: Payer: Managed Care, Other (non HMO) | Admitting: Internal Medicine

## 2019-06-28 ENCOUNTER — Other Ambulatory Visit: Payer: Self-pay | Admitting: Internal Medicine

## 2019-06-28 ENCOUNTER — Telehealth: Payer: Self-pay | Admitting: Neurology

## 2019-06-28 DIAGNOSIS — H532 Diplopia: Secondary | ICD-10-CM

## 2019-06-28 NOTE — Telephone Encounter (Signed)
Pt called and advised on labs ordered

## 2019-06-28 NOTE — Progress Notes (Signed)
Ophthalmologist at [facility] needs additional lab work. He sent order requesting these additional labs and patient presented here as [place] does not have in house lab. These include Myasthenia gravis studies and thyroid antibody studies. These were obtained by phlebotomist here today.

## 2019-06-28 NOTE — Telephone Encounter (Signed)
Patient called requesting to confirm the orders entered for his labs done last week. He said he is going to get more labs and wants to avoid duplicating them.

## 2019-06-29 LAB — TSH: TSH: 1.72 mIU/L (ref 0.40–4.50)

## 2019-06-29 LAB — ACETYLCHOLINE RECEPTOR, BINDING: A CHR BINDING ABS: <0.3 nmol/L

## 2019-06-29 LAB — STRIATED MUSCLE ANTIBODY: STRIATED MUSCLE AB SCREEN: NEGATIVE

## 2019-07-02 LAB — MYASTHENIA GRAVIS PANEL 1
A CHR BINDING ABS: <0.3 nmol/L
STRIATED MUSCLE AB SCREEN: NEGATIVE

## 2019-07-02 LAB — TRAB (TSH RECEPTOR BINDING ANTIBODY): TRAB: <1 IU/L (ref <=2.00)

## 2019-07-05 NOTE — Telephone Encounter (Signed)
Faxed Lab results that [facility] Doctor wanted done to [phone].

## 2019-07-28 ENCOUNTER — Ambulatory Visit
Admission: RE | Admit: 2019-07-28 | Discharge: 2019-07-28 | Disposition: A | Discharge status: Home / Self Care | Payer: Managed Care, Other (non HMO) | Source: Ambulatory Visit | Attending: Neurology | Admitting: Neurology

## 2019-07-28 ENCOUNTER — Other Ambulatory Visit: Payer: Self-pay

## 2019-07-28 ENCOUNTER — Other Ambulatory Visit: Payer: Self-pay | Admitting: Neurology

## 2019-07-28 DIAGNOSIS — Z77018 Contact with and (suspected) exposure to other hazardous metals: Secondary | ICD-10-CM

## 2019-07-28 DIAGNOSIS — H532 Diplopia: Secondary | ICD-10-CM

## 2019-07-28 MED ORDER — GADOBENATE DIMEGLUMINE 529 MG/ML IV SOLN
20.0000 mL | Freq: Once | INTRAVENOUS | Status: AC | PRN
  Administered 2019-07-28: 18:00:00 20 mL via INTRAVENOUS

## 2019-08-02 ENCOUNTER — Other Ambulatory Visit: Payer: Self-pay | Admitting: *Deleted

## 2019-08-02 ENCOUNTER — Telehealth: Payer: Self-pay | Admitting: Internal Medicine

## 2019-08-02 DIAGNOSIS — H532 Diplopia: Secondary | ICD-10-CM

## 2019-08-02 NOTE — Telephone Encounter (Signed)
Called patient and he is going back to eye doctor on Wednesday, he will have them fax the office notes to [place], and I let him know I placed a referral to [facility].

## 2019-08-02 NOTE — Telephone Encounter (Signed)
We were not provided the report. I was able to locate it today in Epic. Have not heard recently from [facility] or neurologist. How is his eye situation?  He needs to be referred to [facility] for pansinusitis. Thanks, MJB

## 2019-08-02 NOTE — Telephone Encounter (Signed)
Sergio Sparks [phone]  Jermane called to see if you had seen the results of his MRI that Dr Everlena Cooper had ordered. He stated that they had found that he had sinus problems. What should he do about this?

## 2019-08-04 NOTE — Telephone Encounter (Signed)
Faxed referral to [facility] again.

## 2019-08-13 DIAGNOSIS — J324 Chronic pansinusitis: Secondary | ICD-10-CM | POA: Insufficient documentation

## 2019-08-13 DIAGNOSIS — H532 Diplopia: Secondary | ICD-10-CM | POA: Insufficient documentation

## 2019-08-20 ENCOUNTER — Other Ambulatory Visit: Payer: Managed Care, Other (non HMO) | Admitting: Internal Medicine

## 2019-08-23 ENCOUNTER — Other Ambulatory Visit: Payer: Self-pay

## 2019-08-23 ENCOUNTER — Encounter: Payer: Self-pay | Admitting: Internal Medicine

## 2019-08-23 ENCOUNTER — Ambulatory Visit (INDEPENDENT_AMBULATORY_CARE_PROVIDER_SITE_OTHER): Payer: Managed Care, Other (non HMO) | Admitting: Internal Medicine

## 2019-08-23 VITALS — BP 102/80 | HR 76 | Temp 98.7°F | Ht 71.0 in | Wt 220.0 lb

## 2019-08-23 DIAGNOSIS — Z0000 Encounter for general adult medical examination without abnormal findings: Secondary | ICD-10-CM

## 2019-08-23 DIAGNOSIS — I10 Essential (primary) hypertension: Secondary | ICD-10-CM | POA: Diagnosis not present

## 2019-08-23 DIAGNOSIS — R7302 Impaired glucose tolerance (oral): Secondary | ICD-10-CM

## 2019-08-23 DIAGNOSIS — H532 Diplopia: Secondary | ICD-10-CM

## 2019-08-23 DIAGNOSIS — E7800 Pure hypercholesterolemia, unspecified: Secondary | ICD-10-CM

## 2019-08-23 LAB — POCT URINALYSIS DIPSTICK
Appearance: NEGATIVE
Bilirubin, UA: NEGATIVE
Blood, UA: NEGATIVE
Glucose, UA: NEGATIVE
Ketones, UA: NEGATIVE
Leukocytes, UA: NEGATIVE
Nitrite, UA: NEGATIVE
Odor: NEGATIVE
Protein, UA: NEGATIVE
Spec Grav, UA: 1.015 (ref 1.010–1.025)
Urobilinogen, UA: 0.2 E.U./dL
pH, UA: 6.5 (ref 5.0–8.0)

## 2019-08-23 NOTE — Progress Notes (Signed)
Subjective:    Patient ID: Sergio Sparks, male    DOB: Mar 02, 1976, 44 y.o.   MRN: [id number]  HPI 44 year old Male in today for follow-up he has a history of impaired glucose tolerance, hypertension, hypertriglyceridemia and low HDL.  History of sleep apnea.  Recently has had issues with vertical diplopia and has been evaluated by [facility].  He had MRI of the brain and MRI of the orbits done by neurologist, Dr. Tomi Likens.  Myasthenia gravis was considered but ruled out.  Patient had pansinusitis on MRI study and was referred to Dr. Constance Holster, ENT physician and is now on clindamycin 300 mg 3 times a day for couple of weeks and then will have repeat CT scan per ENT physician.  He is to take fenofibrate for hypertriglyceridemia but came off of that when he lost some weight.  He also was referred to [facility] recently regarding vasectomy which is being done on Thursday, March 25  Neurologist at [facility] saw him in January.  MRI of the brain and orbits ordered.  TSH was normal.  Myasthenia panel was negative.  Hemoglobin A1c in January was 5.8%.  He has a history of impaired glucose tolerance, hypertension and hyperlipidemia.  He no longer takes lipid-lowering medication.  He is on ramipril 10 mg daily.   At one point he was working in [place] and now is working in [place].  Lives in [place] about 30 miles from [place].  Social history: He is married.  He has 3 children.  Does not smoke.  Social alcohol consumption.  Family history: Mother with history of obesity, hypertension diabetes.  Father with history of hypertension hyperlipidemia diabetes, thyroid cancer status post thyroidectomy.  1 brother in good health.  No sisters.  LDL cholesterol was mildly elevated at 117 in January.  He had a low HDL of 34.  Triglycerides are normal off medication.  Last tetanus immunization given 2014.  Review of Systems see above-vertical diplopia gets worse late  in the evening when he is fatigued.  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     Objective:   Physical Exam Blood pressure 102/80, pulse 76, BMI 30.68 temperature 98.7 degrees orally, weight 220 pounds, pulse oximetry 97%  Skin warm and dry.  Nodes none.  TMs have cerumen in external canals.  Neck is supple without JVD thyromegaly or carotid bruits.  Chest clear to auscultation.  Cardiac exam regular rate and rhythm normal S1 and S2 without murmurs or gallops.  Abdomen soft nondistended without hepatosplenomegaly masses or tenderness.  Genitalia deferred to urologist.  No lower extremity edema.  Neuro intact without focal deficits.       Assessment & Plan:  BMI 30.68-continue to work on diet exercise and weight loss  Hypertriglyceridemia-resolved with weight loss.  Has mild elevation of LDL.  Continue to watch diet exercise and lose a little more weight.  Vertical diplopia to be evaluated at [facility] ophthalmology department in the near future.  MRI shows no evidence of tumor.  Did not have evidence of myasthenia gravis.  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

## 2019-08-23 NOTE — Patient Instructions (Signed)
Patient will be seen at [facility] ophthalmology soon regarding Brown syndrome and vertical diplopia.  Continue ramipril for hypertension.  Watch diet.  Try to get more exercise.  Follow-up here in 6 months for office visit, blood pressure check and fasting lipid panel.  Finished course of antibiotics prescribed by ENT physician for pansinusitis.

## 2019-10-01 ENCOUNTER — Telehealth: Payer: Self-pay | Admitting: Internal Medicine

## 2019-10-01 MED ORDER — RAMIPRIL 10 MG PO CAPS: ORAL_CAPSULE | ORAL | 0 refills | Status: DC

## 2019-10-01 NOTE — Telephone Encounter (Signed)
Sergio Sparks [phone]  [name] called to say he needs refill on below medication sent to new pharmacy. He also wants 90 day supply.  ramipril (ALTACE) 10 MG capsule  [facility].

## 2019-10-01 NOTE — Telephone Encounter (Signed)
Refill to new pharmacy x 90 days

## 2019-10-11 ENCOUNTER — Telehealth: Payer: Self-pay | Admitting: Internal Medicine

## 2019-10-11 NOTE — Telephone Encounter (Signed)
Tomorrow :)

## 2019-10-11 NOTE — Telephone Encounter (Signed)
Pt calling wanting to schedule appt, pt fell about a week ago on his bottom, He said the bruisining went away but he has a knot right bellow the belt ling on the right on his back side, He said it is getting pretty painful and he wanted to see if he could be seen for it. Please advise

## 2019-10-11 NOTE — Telephone Encounter (Signed)
Scheduled

## 2019-10-12 ENCOUNTER — Other Ambulatory Visit: Payer: Self-pay

## 2019-10-12 ENCOUNTER — Encounter: Payer: Self-pay | Admitting: Internal Medicine

## 2019-10-12 ENCOUNTER — Ambulatory Visit: Payer: Managed Care, Other (non HMO) | Admitting: Internal Medicine

## 2019-10-12 VITALS — BP 120/88 | HR 88 | Ht 70.0 in | Wt 234.0 lb

## 2019-10-12 DIAGNOSIS — R7302 Impaired glucose tolerance (oral): Secondary | ICD-10-CM

## 2019-10-12 DIAGNOSIS — I10 Essential (primary) hypertension: Secondary | ICD-10-CM

## 2019-10-12 DIAGNOSIS — E781 Pure hyperglyceridemia: Secondary | ICD-10-CM | POA: Diagnosis not present

## 2019-10-12 DIAGNOSIS — S300XXA Contusion of lower back and pelvis, initial encounter: Secondary | ICD-10-CM | POA: Diagnosis not present

## 2019-10-17 NOTE — Progress Notes (Signed)
   Subjective:    Patient ID: Sergio Sparks, male    DOB: [birth year], 44 y.o.   MRN: [id number]  HPI 44 year old Male with history of HTN, vertical diplopia which is improving,sleep apnea, hypertriglyceridemia, and impaired glucose tolerance.Hx of pansinusitis treated recently by ENT.  Several days ago, he was roping cattle on his horse [place].The rope got tangled, horse ran out from under the patient a patient took a hard fall to the ground landing on his buttocks. Has sustained contusion to right buttock which was concerning to him as it was rather severe.  Fortunately, he did not suffer back injury or hip injury.  He shows me a picture just after event which shows markedly contused right buttock area. This has improved in just a few days.  Tetanus immunization is up to date.  He has a new job and will be commuting to the [place] area instead of [place].    Review of Systems he did not strike his head. Did not seek emergency attention after the accident.     Objective:   Physical Exam BP 120/88, pulse 88, Pulse ox 96% Weight 234 pounds. BMI 33.58  Right buttock show wide spead contusion resolving but there is an area in the center that is fluctuant and appears to be a seroma. It is non-tender.     Assessment & Plan:  Contusion right buttock severe with probable seroma. Observe for resolution over the next few weeks. May take 4-6 weeks to resolve. If seroma, does not resolve will refer to General Surgeon.

## 2019-10-17 NOTE — Patient Instructions (Signed)
Let us observe contusion right buttock and probable seroma for the next 4 to 6 weeks to see if will resolve.

## 2019-12-31 ENCOUNTER — Other Ambulatory Visit: Payer: Self-pay | Admitting: Internal Medicine

## 2020-02-24 ENCOUNTER — Other Ambulatory Visit: Payer: Self-pay

## 2020-02-24 ENCOUNTER — Ambulatory Visit (INDEPENDENT_AMBULATORY_CARE_PROVIDER_SITE_OTHER): Payer: Managed Care, Other (non HMO) | Admitting: Internal Medicine

## 2020-02-24 ENCOUNTER — Encounter: Payer: Self-pay | Admitting: Internal Medicine

## 2020-02-24 VITALS — BP 108/70 | HR 78 | Ht 70.0 in | Wt 225.0 lb

## 2020-02-24 DIAGNOSIS — H532 Diplopia: Secondary | ICD-10-CM

## 2020-02-24 DIAGNOSIS — R7302 Impaired glucose tolerance (oral): Secondary | ICD-10-CM | POA: Diagnosis not present

## 2020-02-24 DIAGNOSIS — Z6832 Body mass index (BMI) 32.0-32.9, adult: Secondary | ICD-10-CM | POA: Diagnosis not present

## 2020-02-24 DIAGNOSIS — E781 Pure hyperglyceridemia: Secondary | ICD-10-CM | POA: Diagnosis not present

## 2020-02-24 DIAGNOSIS — I10 Essential (primary) hypertension: Secondary | ICD-10-CM | POA: Diagnosis not present

## 2020-02-25 LAB — LIPID PANEL
Cholesterol: 185 mg/dL (ref <200)
HDL: 38 mg/dL — ABNORMAL LOW (ref >=40)
LDL Cholesterol (Calc): 115 mg/dL (calc) — ABNORMAL HIGH
Non-HDL Cholesterol (Calc): 147 mg/dL (calc) — ABNORMAL HIGH (ref <130)
Total CHOL/HDL Ratio: 4.9 (calc) (ref <5.0)
Triglycerides: 204 mg/dL — ABNORMAL HIGH (ref <150)

## 2020-02-25 LAB — HEMOGLOBIN A1C
Hgb A1c MFr Bld: 5.9 % of total Hgb — ABNORMAL HIGH (ref <5.7)
Mean Plasma Glucose: 123 (calc)
eAG (mmol/L): 6.8 (calc)

## 2020-02-25 NOTE — Progress Notes (Signed)
   Subjective:    Patient ID: Sergio Sparks, male    DOB: 06/21/1975, 44 y.o.   MRN: [id number]  HPI 44 year old Male for follow-up on hyperlipidemia and impaired glucose tolerance.  Also has history of hypertension treated with ramipril 10 mg daily.  History of diplopia followed by Ophthalmology.  Initially presented with vertical diplopia in January 2021.  Says vision is fairly good but declines when he is fatigued toward the end of the day. Patient saw Dr. Alvester Morin, urologist for consultation regarding vasectomy in March.  His labs checked today showed triglycerides are elevated at 204, he has a low HDL of 38 and a total cholesterol of 185.  LDL is 115.  He has not wanted to be on statin medication.  History of mild glucose intolerance with hemoglobin A1c in January of 5.8% and is now 5.9%.  In January total cholesterol was 174, HDL 34, triglycerides 122 and LDL 117.  His triglycerides have increased significantly.  Really needs to watch his diet and get more exercise.  I do think he needs to be on statin medication.  He could be on low-dose Crestor 5 mg 3 times a week and that would help significantly.  We will discuss this with him again at next visit.  He declines flu vaccine.  His tetanus immunization is up-to-date.    Review of Systems see above-he needs form filled out to send to his insurance carrier regarding examination and lab studies.     Objective:   Physical Exam  Blood pressure 108/70 pulse 78 pulse oximetry 98% weight 225 pounds height 5 feet 10 inches BMI 32.28 Skin warm and dry.  No carotid bruits.  Chest clear to auscultation.  Cardiac exam regular rate and rhythm normal S1 and S2 without murmurs or gallops.  No lower extremity edema.     Assessment & Plan:  Hypertriglyceridemia-needs to be on statin medication  BMI 32.28-needs to diet exercise and lose weight  History of vertical diplopia followed by ophthalmology.  Myasthenia gravis was ruled out with initial  evaluation.  Essential hypertension-stable on current regimen of ramipril.  Plan: Encourage diet exercise and weight loss.  Continue current medications and follow-up in 6 months.  Declines flu vaccine.

## 2020-02-25 NOTE — Patient Instructions (Signed)
It was a pleasure to see you today.  Continue current medications.  Watch diet and get more exercise.  Follow-up in 6 months.

## 2020-04-01 ENCOUNTER — Other Ambulatory Visit: Payer: Self-pay | Admitting: Internal Medicine

## 2020-06-15 ENCOUNTER — Other Ambulatory Visit: Payer: Self-pay | Admitting: Internal Medicine

## 2020-06-15 NOTE — Telephone Encounter (Signed)
CPE is due after March 22. Please book before refilling.

## 2020-09-08 ENCOUNTER — Other Ambulatory Visit: Payer: Self-pay

## 2020-09-08 ENCOUNTER — Encounter: Payer: Self-pay | Admitting: Internal Medicine

## 2020-09-08 ENCOUNTER — Ambulatory Visit (INDEPENDENT_AMBULATORY_CARE_PROVIDER_SITE_OTHER): Payer: Managed Care, Other (non HMO) | Admitting: Internal Medicine

## 2020-09-08 VITALS — BP 100/70 | HR 80 | Ht 70.0 in | Wt 228.0 lb

## 2020-09-08 DIAGNOSIS — I10 Essential (primary) hypertension: Secondary | ICD-10-CM

## 2020-09-08 DIAGNOSIS — R7302 Impaired glucose tolerance (oral): Secondary | ICD-10-CM | POA: Diagnosis not present

## 2020-09-08 DIAGNOSIS — E781 Pure hyperglyceridemia: Secondary | ICD-10-CM

## 2020-09-08 DIAGNOSIS — Z0000 Encounter for general adult medical examination without abnormal findings: Secondary | ICD-10-CM

## 2020-09-08 DIAGNOSIS — E7800 Pure hypercholesterolemia, unspecified: Secondary | ICD-10-CM

## 2020-09-08 DIAGNOSIS — H5060 Mechanical strabismus, unspecified: Secondary | ICD-10-CM | POA: Diagnosis not present

## 2020-09-08 DIAGNOSIS — Z6832 Body mass index (BMI) 32.0-32.9, adult: Secondary | ICD-10-CM | POA: Diagnosis not present

## 2020-09-08 LAB — POCT URINALYSIS DIPSTICK
Appearance: NEGATIVE
Bilirubin, UA: NEGATIVE
Blood, UA: NEGATIVE
Glucose, UA: NEGATIVE
Ketones, UA: NEGATIVE
Leukocytes, UA: NEGATIVE
Nitrite, UA: NEGATIVE
Odor: NEGATIVE
Protein, UA: NEGATIVE
Spec Grav, UA: 1.015 (ref 1.010–1.025)
Urobilinogen, UA: 0.2 E.U./dL
pH, UA: 6.5 (ref 5.0–8.0)

## 2020-09-08 NOTE — Progress Notes (Signed)
   Subjective:    Patient ID: Sergio Sparks, male    DOB: 06-Jul-1975, 45 y.o.   MRN: [id number]  HPI 45 year old Male for health maintenance exam and evaluation of medical issues.  He has a history of impaired glucose tolerance, sleep apnea, hypertension, hypertriglyceridemia and metabolic syndrome.  Was able to come off lipid-lowering medication with weight loss.  He had consult regarding vasectomy by Dr. Alvester Morin in March 2021.  History of vertical double vision seen at [facility] here in [place].  Onset was sudden in mid December 2020.  He had no head trauma.  Was diagnosed with Brown syndrome.  Negative MRI of the brain.  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  It was recommended that he see allergist but I do not think he ever did that.  He apparently improved and decided against endoscopic sinus surgery.  He was referred to allergist as suggested by ENT physician [facility]) but he declined appointment.  In May 2021 he took a hard fall when the horse ran out from under him.  He landed on the ground.  Suffered a contusion of the right buttock.  It resolved.      Review of Systems reports vision markedly improved.     Objective:   Physical Exam BP 100/70, pulse 80, pulse ox 98%, BMI 32.71 Weight 228 pounds He has no nystagmus.  PERRLA.  TMs are clear.  Neck is supple.  Chest is clear to auscultation.  Cardiac exam regular rate and rhythm.  Abdomen soft nondistended without hepatosplenomegaly masses or tenderness.  No lower extremity pitting edema.  Affect thought and judgment are normal.      Assessment & Plan:  Essential hypertension-excellent control on current regimen of generic Altace 10 mg daily  BMI 32.71-continue with diet and exercise efforts  History of Brown syndrome-improved  History of hypertriglyceridemia-improved  Low HDL of 35  History of impaired glucose  tolerance-stable at 5.7% on diet alone  History of chronic pansinusitis-seen by ENT but thus far has not undergone surgery- was treated with antibiotics and improved  Plan: Continue Altace for hypertension.  Continue diet and exercise regimen and follow-up in 1 year or as needed.

## 2020-09-09 LAB — HEMOGLOBIN A1C
Hgb A1c MFr Bld: 5.7 % of total Hgb — ABNORMAL HIGH (ref <5.7)
Mean Plasma Glucose: 117 mg/dL
eAG (mmol/L): 6.5 mmol/L

## 2020-09-09 LAB — COMPLETE METABOLIC PANEL WITH GFR
AG Ratio: 1.9 (calc) (ref 1.0–2.5)
ALT: 18 U/L (ref 9–46)
AST: 13 U/L (ref 10–40)
Albumin: 4.5 g/dL (ref 3.6–5.1)
Alkaline phosphatase (APISO): 73 U/L (ref 36–130)
BUN: 9 mg/dL (ref 7–25)
CO2: 29 mmol/L (ref 20–32)
Calcium: 9 mg/dL (ref 8.6–10.3)
Chloride: 103 mmol/L (ref 98–110)
Creat: 0.78 mg/dL (ref 0.60–1.35)
GFR, Est African American: 125 mL/min/{1.73_m2} (ref >=60)
GFR, Est Non African American: 108 mL/min/{1.73_m2} (ref >=60)
Globulin: 2.4 g/dL (calc) (ref 1.9–3.7)
Glucose, Bld: 91 mg/dL (ref 65–99)
Potassium: 3.7 mmol/L (ref 3.5–5.3)
Sodium: 139 mmol/L (ref 135–146)
Total Bilirubin: 0.7 mg/dL (ref 0.2–1.2)
Total Protein: 6.9 g/dL (ref 6.1–8.1)

## 2020-09-09 LAB — LIPID PANEL
Cholesterol: 156 mg/dL (ref <200)
HDL: 35 mg/dL — ABNORMAL LOW (ref >=40)
LDL Cholesterol (Calc): 98 mg/dL (calc)
Non-HDL Cholesterol (Calc): 121 mg/dL (calc) (ref <130)
Total CHOL/HDL Ratio: 4.5 (calc) (ref <5.0)
Triglycerides: 133 mg/dL (ref <150)

## 2020-09-09 LAB — CBC WITH DIFFERENTIAL/PLATELET
Absolute Monocytes: 363 cells/uL (ref 200–950)
Basophils Absolute: 20 cells/uL (ref 0–200)
Basophils Relative: 0.3 %
Eosinophils Absolute: 112 cells/uL (ref 15–500)
Eosinophils Relative: 1.7 %
HCT: 42.1 % (ref 38.5–50.0)
Hemoglobin: 13.8 g/dL (ref 13.2–17.1)
Lymphs Abs: 2277 cells/uL (ref 850–3900)
MCH: 28.6 pg (ref 27.0–33.0)
MCHC: 32.8 g/dL (ref 32.0–36.0)
MCV: 87.3 fL (ref 80.0–100.0)
MPV: 9.2 fL (ref 7.5–12.5)
Monocytes Relative: 5.5 %
Neutro Abs: 3828 cells/uL (ref 1500–7800)
Neutrophils Relative %: 58 %
Platelets: 325 10*3/uL (ref 140–400)
RBC: 4.82 10*6/uL (ref 4.20–5.80)
RDW: 12.5 % (ref 11.0–15.0)
Total Lymphocyte: 34.5 %
WBC: 6.6 10*3/uL (ref 3.8–10.8)

## 2020-10-10 ENCOUNTER — Telehealth: Payer: Self-pay | Admitting: Internal Medicine

## 2020-10-10 NOTE — Telephone Encounter (Signed)
He needs an office visit. May have a sinus infection. Needs to do home Covid test before he comes. Takes a few weeks to get in to see allergist. Maybe we can help in the meantime.

## 2020-10-10 NOTE — Telephone Encounter (Signed)
scheduled

## 2020-10-10 NOTE — Telephone Encounter (Signed)
Sergio Sparks [phone]  An[facility] called to see if he could get a referral to Allergist, he stated he had allergies when he was little and nothing is working right now. He is stopped up, can't breath at night, coughing and blowing out yellowish tinted stuff, has been bad the last month. Has taken Zertec for the last month. His daughter also has allergies. I did let him know he would probably need an office visit before he could get an referral. He did do a home COVID test on Sunday night that was negative.

## 2020-10-17 ENCOUNTER — Ambulatory Visit: Payer: Managed Care, Other (non HMO) | Admitting: Internal Medicine

## 2020-10-24 ENCOUNTER — Other Ambulatory Visit: Payer: Self-pay | Admitting: Internal Medicine

## 2020-10-28 NOTE — Patient Instructions (Signed)
It was a pleasure to see you today.  Blood pressure is under excellent control with generic Altace 10 mg daily.  Continue to work on diet and exercise.  Lipids have improved.  Hemoglobin A1c is stable at 5.7% on diet alone.  Return in 1 year or as needed.  Continue current medications.

## 2021-01-17 ENCOUNTER — Telehealth: Payer: Self-pay | Admitting: Internal Medicine

## 2021-01-17 NOTE — Telephone Encounter (Signed)
LVM to CB and reschedule CPE and Labs, we have it scheduled on Easter Monday and we will be closed that day so we need to changed it.

## 2021-01-18 NOTE — Telephone Encounter (Signed)
Scheduled

## 2021-01-25 IMAGING — MR MR ORBITS WO/W CM
4 of 6 series · 13 of 48 positions shown · IV contrast (multihance)
Comparison: None.

CLINICAL DATA: Vertical diplopia since [DATE].

EXAM:
MRI HEAD AND ORBITS WITHOUT AND WITH CONTRAST
TECHNIQUE: Multiplanar, multiecho pulse sequences of the brain and surrounding
structures were obtained without and with intravenous contrast.
Multiplanar, multiecho pulse sequences of the orbits and surrounding
structures were obtained including fat saturation techniques, before
and after intravenous contrast administration.
CONTRAST:  20mL MULTIHANCE GADOBENATE DIMEGLUMINE 529 MG/ML IV SOLN

[Series 1: T2 fat-sat · coronal · 3.0mm · 0.25mm/px · 3 of 26 slices shown (1 of 2)]
[im 3/26]
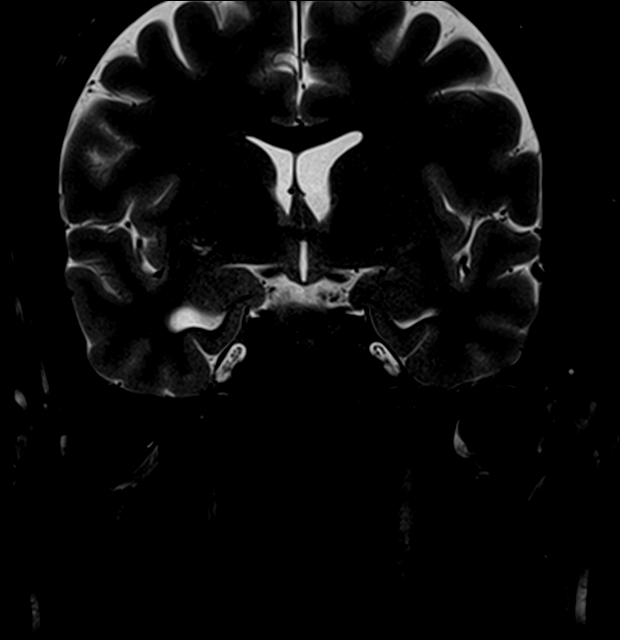
[im 14/26]
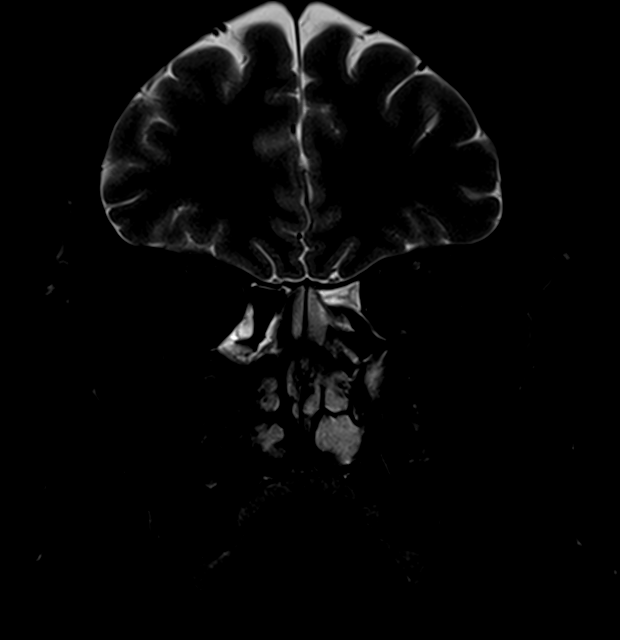
[im 23/26]
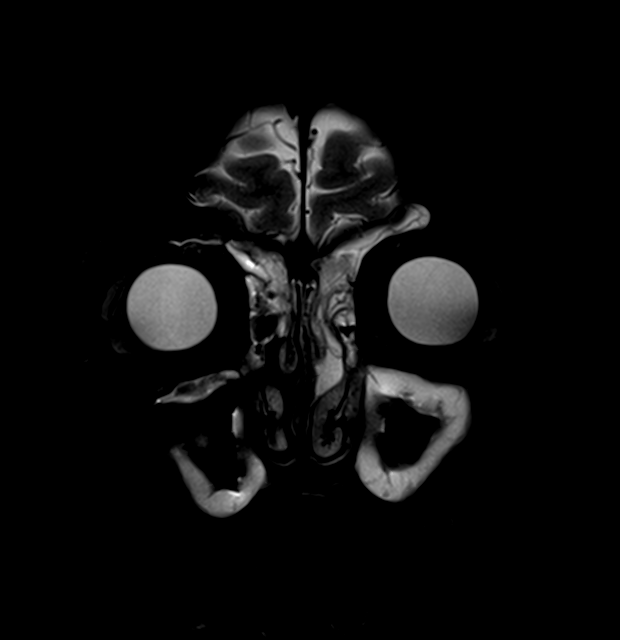

[Series 2: T2 fat-sat · axial · 3.0mm · 0.25mm/px · z∈[-27,+11]mm · 3 of 15 slices shown (2 of 2)]
[im 3/15]
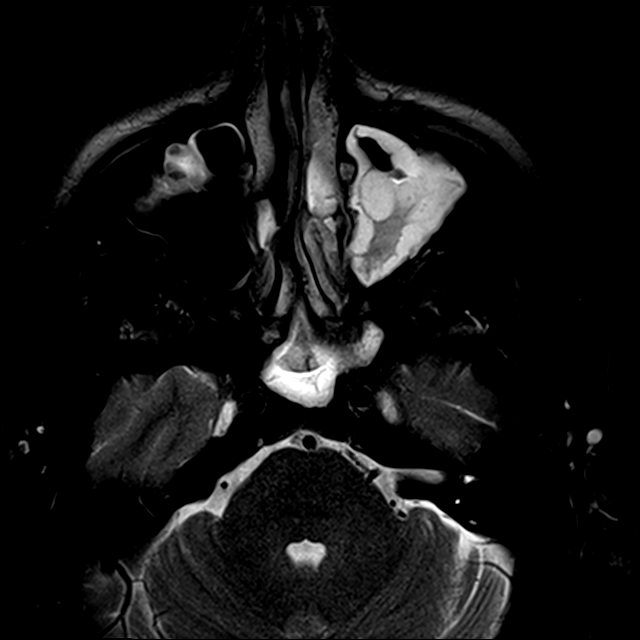
[im 9/15]
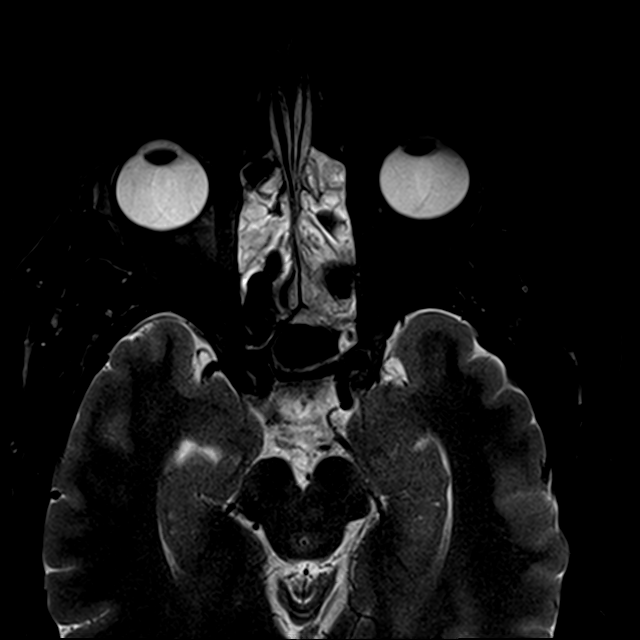
[im 15/15]
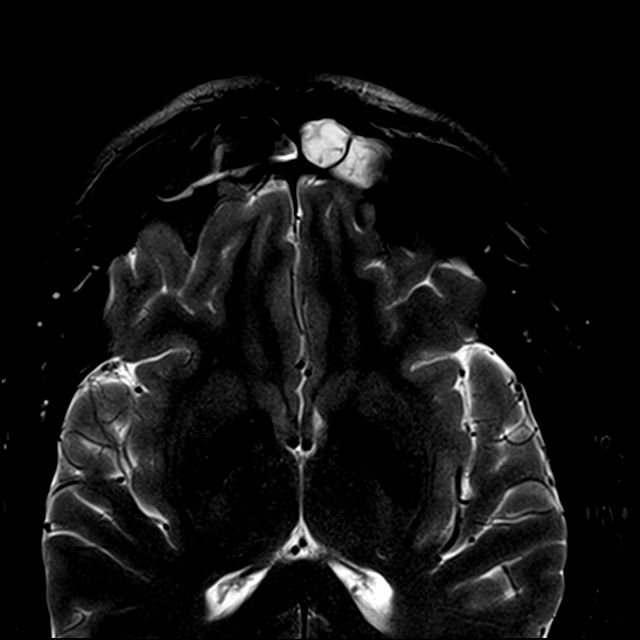

[Series 3: T1 · axial · 3.0mm · 0.25mm/px · z∈[-27,+11]mm · 3 of 15 slices shown]
[im 3/15]
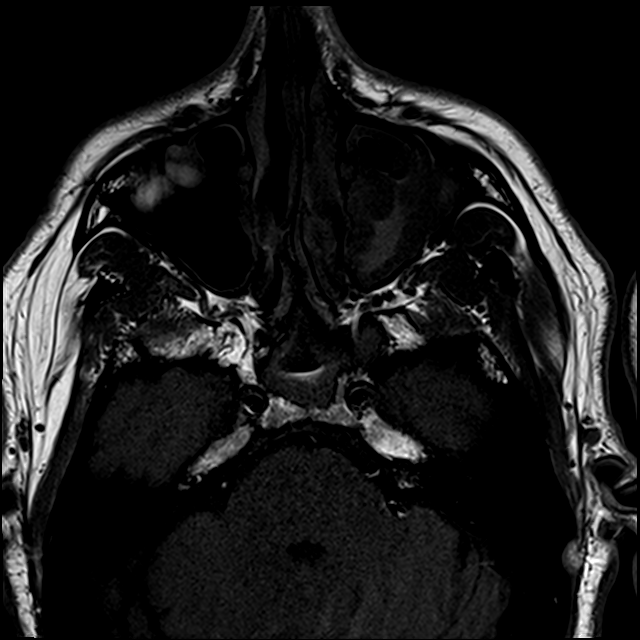
[im 9/15]
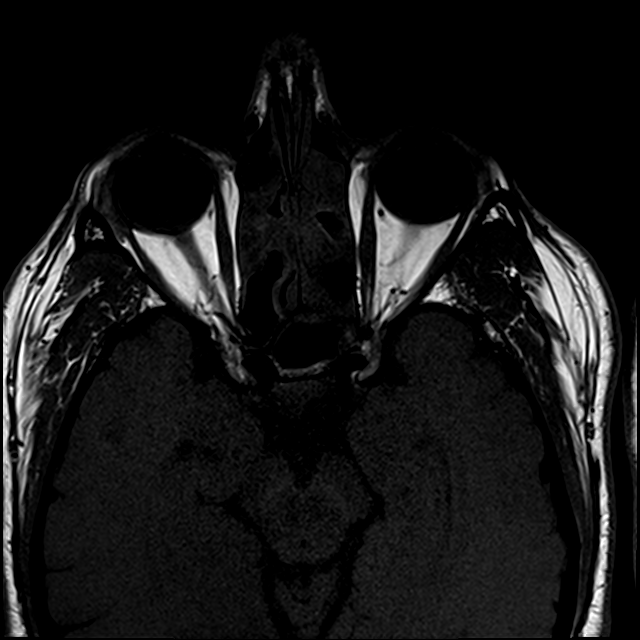
[im 15/15]
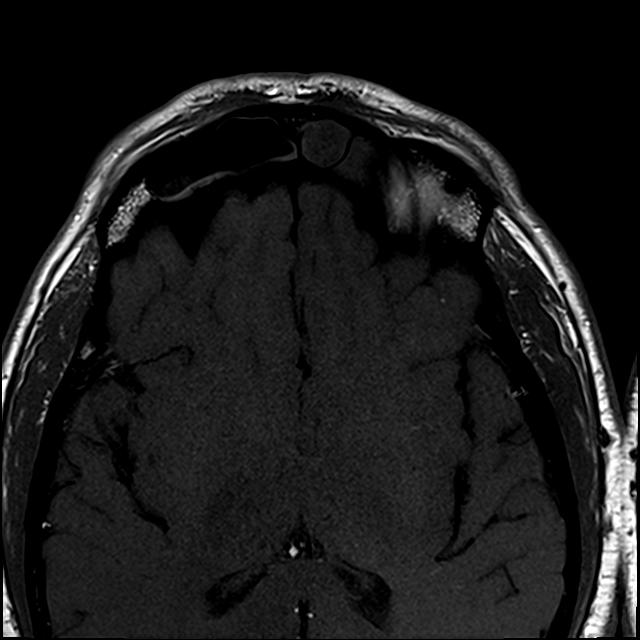

[Series 6: T1 post-contrast · coronal · 3.0mm · 0.25mm/px · 4 of 26 slices shown]
[im 1/26]
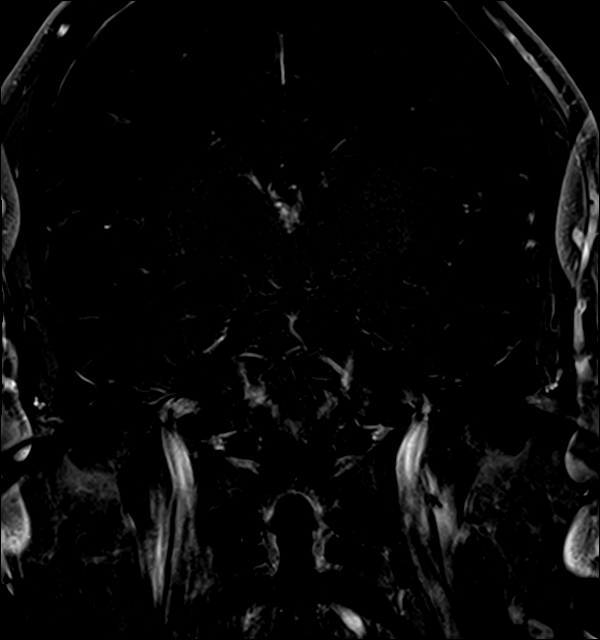
[im 3/26]
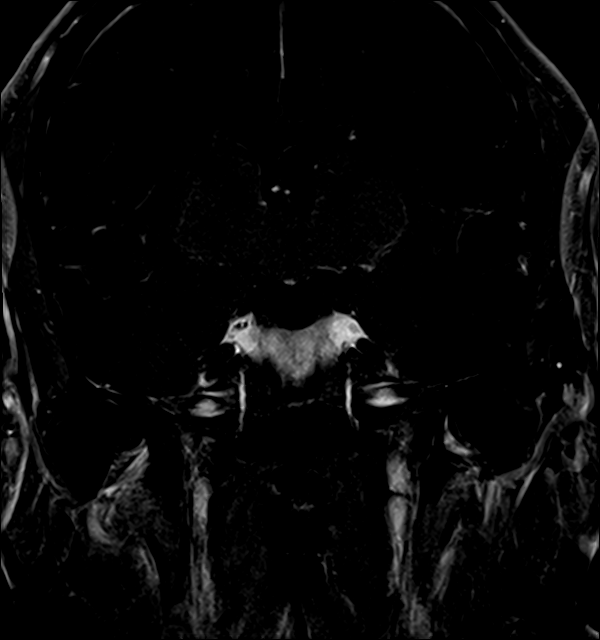
[im 14/26]
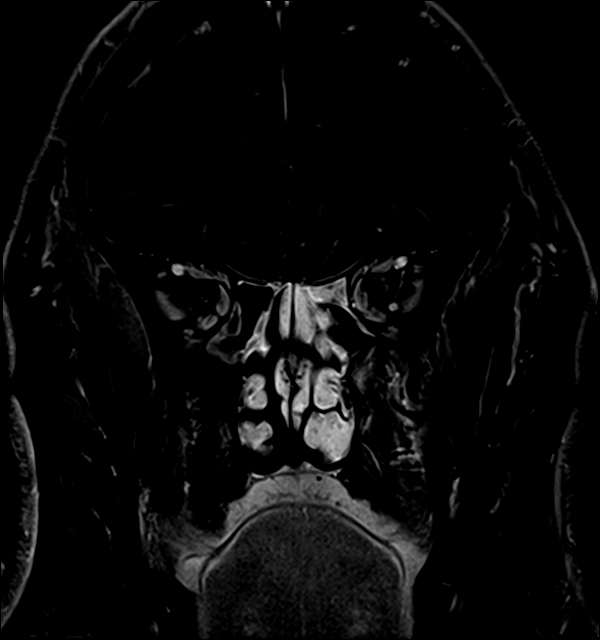
[im 23/26]
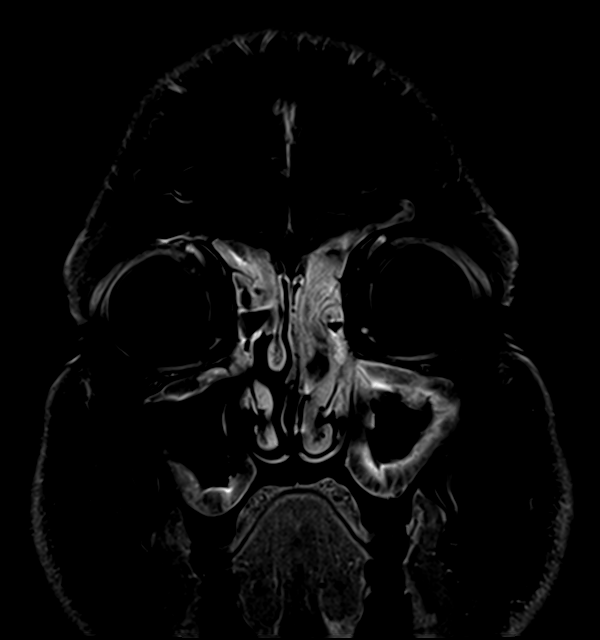

[13 of 48 positions shown; findings below may reference images not displayed]

FINDINGS: MRI HEAD FINDINGS

Brain: There is no evidence of acute infarct, intracranial
hemorrhage, mass, midline shift, or extra-axial fluid collection.
The ventricles and sulci are normal. There are less than 10 punctate
foci of T2 FLAIR hyperintensity in the cerebral white matter
predominantly involving the subcortical frontal lobes. No abnormal
enhancement is identified.

Vascular: Major intracranial vascular flow voids are preserved.
Normal enhancement of the dural venous sinuses.

Skull and upper cervical spine: Unremarkable bone marrow signal.

Other: None.

MRI ORBITS FINDINGS

Orbits: The globes appear symmetric and intact. No optic nerve
swelling or abnormal enhancement is identified. No orbital
inflammation or mass is evident. The extraocular muscles and
lacrimal glands are unremarkable.

Visualized sinuses: Extensive mucosal thickening with near complete
opacification of the left maxillary, bilateral ethmoid, and left
frontal sinuses. Circumferential mucosal thickening in both sphenoid
sinuses with milder mucosal thickening in the right frontal and
right maxillary sinuses. Left maxillary and left sphenoid sinus
fluid levels. No evidence of orbital extension of sinus infection.
Clear mastoid air cells.

Soft tissues: Unremarkable.

Limited intracranial: Unremarkable appearance of the optic chiasm,
cavernous sinuses, and pituitary gland.
IMPRESSION: 1. No acute intracranial abnormality.
2. Minimal cerebral white matter T2 signal changes, nonspecific
though may reflect early chronic small vessel ischemia, migraines,
or prior infection/inflammation. No findings specifically suggestive
of demyelinating disease.
3. Unremarkable appearance of the orbits.
4. Extensive pansinusitis.

## 2021-03-27 ENCOUNTER — Other Ambulatory Visit: Payer: Self-pay

## 2021-03-27 MED ORDER — RAMIPRIL 10 MG PO CAPS: 10.0000 mg | ORAL_CAPSULE | Freq: Every day | ORAL | 1 refills | Status: DC

## 2021-03-27 NOTE — Progress Notes (Signed)
Last OV 09/08/20

## 2021-03-27 NOTE — Progress Notes (Signed)
Sergio Sparks already has CPE and Labs scheduled for April

## 2021-03-27 NOTE — Progress Notes (Signed)
Medication sent in to last until April.

## 2021-04-11 DIAGNOSIS — Z0289 Encounter for other administrative examinations: Secondary | ICD-10-CM

## 2021-09-10 ENCOUNTER — Encounter: Payer: Managed Care, Other (non HMO) | Admitting: Internal Medicine

## 2021-09-14 ENCOUNTER — Encounter: Payer: Self-pay | Admitting: Internal Medicine

## 2021-09-14 ENCOUNTER — Ambulatory Visit (INDEPENDENT_AMBULATORY_CARE_PROVIDER_SITE_OTHER): Payer: Managed Care, Other (non HMO) | Admitting: Internal Medicine

## 2021-09-14 ENCOUNTER — Other Ambulatory Visit: Payer: Managed Care, Other (non HMO)

## 2021-09-14 VITALS — BP 120/78 | HR 61 | Temp 97.8°F | Ht 70.5 in | Wt 234.2 lb

## 2021-09-14 DIAGNOSIS — Z0000 Encounter for general adult medical examination without abnormal findings: Secondary | ICD-10-CM

## 2021-09-14 DIAGNOSIS — I10 Essential (primary) hypertension: Secondary | ICD-10-CM | POA: Diagnosis not present

## 2021-09-14 DIAGNOSIS — E781 Pure hyperglyceridemia: Secondary | ICD-10-CM

## 2021-09-14 DIAGNOSIS — R5383 Other fatigue: Secondary | ICD-10-CM

## 2021-09-14 DIAGNOSIS — Z6833 Body mass index (BMI) 33.0-33.9, adult: Secondary | ICD-10-CM | POA: Diagnosis not present

## 2021-09-14 DIAGNOSIS — R7302 Impaired glucose tolerance (oral): Secondary | ICD-10-CM | POA: Diagnosis not present

## 2021-09-14 LAB — POCT URINALYSIS DIPSTICK
Bilirubin, UA: NEGATIVE
Blood, UA: NEGATIVE
Glucose, UA: NEGATIVE
Ketones, UA: NEGATIVE
Leukocytes, UA: NEGATIVE
Nitrite, UA: NEGATIVE
Protein, UA: NEGATIVE
Spec Grav, UA: 1.015 (ref 1.010–1.025)
Urobilinogen, UA: 0.2 E.U./dL
pH, UA: 6.5 (ref 5.0–8.0)

## 2021-09-14 MED ORDER — RAMIPRIL 10 MG PO CAPS: 10.0000 mg | ORAL_CAPSULE | Freq: Every day | ORAL | 3 refills | Status: DC

## 2021-09-14 NOTE — Progress Notes (Signed)
? ?  Subjective:  ? ? Patient ID: Sergio Sparks, male    DOB: 07-Oct-1975, 46 y.o.   MRN: [id number] ? ?HPI 46 year old Male for health maintenance exam and evaluation of medical issues. ? ?He has a history of impaired glucose tolerance, sleep apnea, hypertension, hypertriglyceridemia and metabolic syndrome.  He was able to come off lipid-lowering medication with weight loss. ? ?History of low HDL in the 30s which is likely inherited.  Has done a good job getting triglycerides under control with diet and exercise. ? ?History of vertical double vision seen at [facility] here in [place].  Onset was sudden in mid December 2020.  He had no head trauma.  He was diagnosed with Manson Passey syndrome.  Negative MRI of the brain.  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 with minimal He improved and decided against endoscopic sinus surgery.  Allergy testing was suggested in 2021 but patient declined appointment with [facility]. ? ?In May 2021 he took a hard fall when the horse ran out from under him.  He landed on the ground and suffered a contusion of right buttock.  It resolved. ? ? ? ? ?Review of Systems no complaints with vision.  No chest pain or shortness of breath.  No issues with urination. ? ?   ?Objective:  ? Physical Exam ? BP 138/82, pulse 61  regular ,T 97.8 degrees, BMI 33.14 ?Skin: Warm and dry.  Nodes none.  TMs clear.  Neck is supple without JVD thyromegaly or carotid bruits.  Chest is clear to auscultation.  Cardiac exam: Regular rate and rhythm.  Abdomen is soft nondistended without hepatosplenomegaly masses or tenderness.  No lower extremity pitting edema.  Neurological exam is intact without gross focal deficits.  Affect thought and judgment are normal. ? ? ?   ?Assessment & Plan:  ?Essential hypertension stable with Ramipril 10 mg daily ? ?Fasting glucose is 104 hemoglobin A1c A1c is 5.8%.  He has mild glucose intolerance.  He  just watches his diet at present time and does not want to be on medication. ? ?History of low HDL which she likely inherited ? ?History of Brown syndrome-symptoms resolved ? ?BMI 33.14.  Weight 234 pounds 4 ounces and was 228 pounds last year.  Needs to work on diet exercise and weight loss. ? ?Also due for screening Colonoscopy. ? ?Due for tetanus immunization update next year. ? ?Plan: Continue current medications and continue to watch blood pressure at home.  Try to lose some weight.  Watch carbohydrates in diet due to elevated hemoglobin A1c.  Return in 1 year or as needed. ? ?

## 2021-09-15 LAB — COMPLETE METABOLIC PANEL WITH GFR
AG Ratio: 1.7 (calc) (ref 1.0–2.5)
ALT: 22 U/L (ref 9–46)
AST: 16 U/L (ref 10–40)
Albumin: 4.3 g/dL (ref 3.6–5.1)
Alkaline phosphatase (APISO): 75 U/L (ref 36–130)
BUN: 10 mg/dL (ref 7–25)
CO2: 30 mmol/L (ref 20–32)
Calcium: 9.2 mg/dL (ref 8.6–10.3)
Chloride: 103 mmol/L (ref 98–110)
Creat: 0.78 mg/dL (ref 0.60–1.29)
Globulin: 2.6 g/dL (calc) (ref 1.9–3.7)
Glucose, Bld: 104 mg/dL — ABNORMAL HIGH (ref 65–99)
Potassium: 4.3 mmol/L (ref 3.5–5.3)
Sodium: 139 mmol/L (ref 135–146)
Total Bilirubin: 0.5 mg/dL (ref 0.2–1.2)
Total Protein: 6.9 g/dL (ref 6.1–8.1)
eGFR: 111 mL/min/{1.73_m2} (ref >=60)

## 2021-09-15 LAB — CBC WITH DIFFERENTIAL/PLATELET
Absolute Monocytes: 319 cells/uL (ref 200–950)
Basophils Absolute: 20 cells/uL (ref 0–200)
Basophils Relative: 0.4 %
Eosinophils Absolute: 118 cells/uL (ref 15–500)
Eosinophils Relative: 2.4 %
HCT: 42.5 % (ref 38.5–50.0)
Hemoglobin: 13.9 g/dL (ref 13.2–17.1)
Lymphs Abs: 1485 cells/uL (ref 850–3900)
MCH: 29.1 pg (ref 27.0–33.0)
MCHC: 32.7 g/dL (ref 32.0–36.0)
MCV: 88.9 fL (ref 80.0–100.0)
MPV: 9.5 fL (ref 7.5–12.5)
Monocytes Relative: 6.5 %
Neutro Abs: 2960 cells/uL (ref 1500–7800)
Neutrophils Relative %: 60.4 %
Platelets: 295 10*3/uL (ref 140–400)
RBC: 4.78 10*6/uL (ref 4.20–5.80)
RDW: 12.4 % (ref 11.0–15.0)
Total Lymphocyte: 30.3 %
WBC: 4.9 10*3/uL (ref 3.8–10.8)

## 2021-09-15 LAB — HEMOGLOBIN A1C
Hgb A1c MFr Bld: 5.8 % of total Hgb — ABNORMAL HIGH (ref <5.7)
Mean Plasma Glucose: 120 mg/dL
eAG (mmol/L): 6.6 mmol/L

## 2021-09-15 LAB — LIPID PANEL
Cholesterol: 144 mg/dL (ref <200)
HDL: 34 mg/dL — ABNORMAL LOW (ref >=40)
LDL Cholesterol (Calc): 89 mg/dL (calc)
Non-HDL Cholesterol (Calc): 110 mg/dL (calc) (ref <130)
Total CHOL/HDL Ratio: 4.2 (calc) (ref <5.0)
Triglycerides: 111 mg/dL (ref <150)

## 2021-09-26 NOTE — Patient Instructions (Addendum)
It was a pleasure to see you today.  Return in 1 year or as needed.  Watch carbohydrates in diet due to elevated hemoglobin A1c.  Try to lose some weight.  Watch blood pressure at home.  Continue Ramipril for hypertension. ?

## 2022-04-18 DIAGNOSIS — Z0289 Encounter for other administrative examinations: Secondary | ICD-10-CM

## 2022-09-09 NOTE — Progress Notes (Signed)
Patient Care Team: Margaree Mackintosh, MD as PCP - General (Internal Medicine) Drema Dallas, DO as Consulting Physician (Neurology)  Visit Date: 09/09/22  Subjective:    Patient ID: Lian Tanori , Male   DOB: 1975-07-10, 47 y.o.    MRN: [id number]   47 y.o. Male presents today for annual comprehensive physical exam. Patient has a past medical history of hypertension.  History of hypertension/heart disease?? Treated with ramipril 10 mg daily.  Due for first colonoscopy.    Vaccine counseling: UTD on flu, tetanus vaccines.  No past medical history on file.   Family History  Problem Relation Age of Onset   Diabetes Mother    Hypertension Mother    Diabetes Father    Hypertension Father     Social History   Social History Narrative   Not on file      ROS      Objective:   Vitals: There were no vitals taken for this visit.   Physical Exam    Results:   Studies obtained and personally reviewed by me:  Imaging, colonoscopy, mammogram, bone density scan, echocardiogram, heart cath, stress test, CT calcium score, etc.    Labs:       Component Value Date/Time   NA 139 09/14/2021 0950   K 4.3 09/14/2021 0950   CL 103 09/14/2021 0950   CO2 30 09/14/2021 0950   GLUCOSE 104 (H) 09/14/2021 0950   BUN 10 09/14/2021 0950   CREATININE 0.78 09/14/2021 0950   CALCIUM 9.2 09/14/2021 0950   PROT 6.9 09/14/2021 0950   ALBUMIN 4.4 05/17/2016 1150   AST 16 09/14/2021 0950   ALT 22 09/14/2021 0950   ALKPHOS 57 05/17/2016 1150   BILITOT 0.5 09/14/2021 0950   GFRNONAA 108 09/08/2020 1148   GFRAA 125 09/08/2020 1148     Lab Results  Component Value Date   WBC 4.9 09/14/2021   HGB 13.9 09/14/2021   HCT 42.5 09/14/2021   MCV 88.9 09/14/2021   PLT 295 09/14/2021    Lab Results  Component Value Date   CHOL 144 09/14/2021   HDL 34 (L) 09/14/2021   LDLCALC 89 09/14/2021   TRIG 111 09/14/2021   CHOLHDL 4.2 09/14/2021    Lab Results  Component Value  Date   HGBA1C 5.8 (H) 09/14/2021     Lab Results  Component Value Date   TSH 1.72 06/24/2019     Lab Results  Component Value Date   PSA 0.14 05/16/2015   PSA 0.14 02/12/2013         Assessment & Plan:   Hypertension- stable on Ramipril 10 mg daily.  Hypertriglyceridemia-frequently seen with glucose intolerance.  Very well could be inherited.  Does not want to be on statin medication or triglyceride lowering medication.  Follow-up in 1 year.  Low HDL  Elevated LDL of 118 and previously was 89 last year.  Highest triglycerides were 204 in 2021.  Would like to place him on statin medication and let him work on diet and exercise but he declines at this time.  He would like to try to work on diet and exercise without medication and follow-up in a year.  BMI 33.40 weight 236 pounds 1.9 ounces.  Patient has gained 8 pounds since April 2022.  Would like to see BMI less than 30.  History of sleep apnea-does not want CPAP  Impaired glucose tolerance-Hemoglobin A1c which is a measure of glucose control has increased from 5.8% last year to  6%.  He does not want to be on medication.  Continue to work on diet exercise and weight loss.  Continue Ramipril for hypertension and follow-up in 1 year.  Needs to work on diet exercise and weight loss.  Losing weight will help triglycerides and hypertension as well as glucose intolerance.  Next year if not improving with these parameters will need to have more discussion regarding medications  Screening colonoscopy is now due as well.  I,Alexander Ruley,acting as a Neurosurgeon for Margaree Mackintosh, MD.,have documented all relevant documentation on the behalf of Margaree Mackintosh, MD,as directed by  Margaree Mackintosh, MD while in the presence of Margaree Mackintosh, MD.   I, Margaree Mackintosh, MD, have reviewed all documentation for this visit. The documentation on 09/28/22 for the exam, diagnosis, procedures, and orders are all accurate and complete.

## 2022-09-16 ENCOUNTER — Ambulatory Visit: Payer: Managed Care, Other (non HMO) | Admitting: Internal Medicine

## 2022-09-16 ENCOUNTER — Other Ambulatory Visit: Payer: Managed Care, Other (non HMO)

## 2022-09-16 ENCOUNTER — Encounter: Payer: Self-pay | Admitting: Internal Medicine

## 2022-09-16 VITALS — BP 122/74 | HR 77 | Temp 97.7°F | Ht 70.5 in | Wt 236.1 lb

## 2022-09-16 DIAGNOSIS — E786 Lipoprotein deficiency: Secondary | ICD-10-CM

## 2022-09-16 DIAGNOSIS — I10 Essential (primary) hypertension: Secondary | ICD-10-CM | POA: Diagnosis not present

## 2022-09-16 DIAGNOSIS — Z6833 Body mass index (BMI) 33.0-33.9, adult: Secondary | ICD-10-CM

## 2022-09-16 DIAGNOSIS — E7800 Pure hypercholesterolemia, unspecified: Secondary | ICD-10-CM

## 2022-09-16 DIAGNOSIS — E781 Pure hyperglyceridemia: Secondary | ICD-10-CM | POA: Diagnosis not present

## 2022-09-16 DIAGNOSIS — R7302 Impaired glucose tolerance (oral): Secondary | ICD-10-CM

## 2022-09-16 DIAGNOSIS — Z0000 Encounter for general adult medical examination without abnormal findings: Secondary | ICD-10-CM | POA: Diagnosis not present

## 2022-09-16 LAB — POCT URINALYSIS DIPSTICK
Bilirubin, UA: NEGATIVE
Blood, UA: NEGATIVE
Glucose, UA: NEGATIVE
Ketones, UA: NEGATIVE
Leukocytes, UA: NEGATIVE
Nitrite, UA: NEGATIVE
Protein, UA: NEGATIVE
Spec Grav, UA: 1.015 (ref 1.010–1.025)
Urobilinogen, UA: 0.2 E.U./dL
pH, UA: 5 (ref 5.0–8.0)

## 2022-09-17 LAB — MICROALBUMIN / CREATININE URINE RATIO
Creatinine, Urine: 120 mg/dL (ref 20–320)
Microalb Creat Ratio: 3 mg/g creat (ref <30)
Microalb, Ur: 0.4 mg/dL

## 2022-09-20 LAB — HEMOGLOBIN A1C
Hgb A1c MFr Bld: 6 % of total Hgb — ABNORMAL HIGH (ref <5.7)
Mean Plasma Glucose: 126 mg/dL
eAG (mmol/L): 7 mmol/L

## 2022-09-20 LAB — COMPLETE METABOLIC PANEL WITH GFR
AG Ratio: 1.7 (calc) (ref 1.0–2.5)
ALT: 24 U/L (ref 9–46)
AST: 16 U/L (ref 10–40)
Albumin: 4.7 g/dL (ref 3.6–5.1)
Alkaline phosphatase (APISO): 67 U/L (ref 36–130)
BUN: 12 mg/dL (ref 7–25)
CO2: 27 mmol/L (ref 20–32)
Calcium: 9.5 mg/dL (ref 8.6–10.3)
Chloride: 102 mmol/L (ref 98–110)
Creat: 0.8 mg/dL (ref 0.60–1.29)
Globulin: 2.8 g/dL (calc) (ref 1.9–3.7)
Glucose, Bld: 96 mg/dL (ref 65–99)
Potassium: 4.2 mmol/L (ref 3.5–5.3)
Sodium: 140 mmol/L (ref 135–146)
Total Bilirubin: 0.6 mg/dL (ref 0.2–1.2)
Total Protein: 7.5 g/dL (ref 6.1–8.1)
eGFR: 109 mL/min/{1.73_m2} (ref >=60)

## 2022-09-20 LAB — CBC WITH DIFFERENTIAL/PLATELET
Absolute Monocytes: 407 cells/uL (ref 200–950)
Basophils Absolute: 21 cells/uL (ref 0–200)
Basophils Relative: 0.3 %
Eosinophils Absolute: 110 cells/uL (ref 15–500)
Eosinophils Relative: 1.6 %
HCT: 44.1 % (ref 38.5–50.0)
Hemoglobin: 14.4 g/dL (ref 13.2–17.1)
Lymphs Abs: 1877 cells/uL (ref 850–3900)
MCH: 28.3 pg (ref 27.0–33.0)
MCHC: 32.7 g/dL (ref 32.0–36.0)
MCV: 86.6 fL (ref 80.0–100.0)
MPV: 9.2 fL (ref 7.5–12.5)
Monocytes Relative: 5.9 %
Neutro Abs: 4485 cells/uL (ref 1500–7800)
Neutrophils Relative %: 65 %
Platelets: 327 10*3/uL (ref 140–400)
RBC: 5.09 10*6/uL (ref 4.20–5.80)
RDW: 12.2 % (ref 11.0–15.0)
Total Lymphocyte: 27.2 %
WBC: 6.9 10*3/uL (ref 3.8–10.8)

## 2022-09-20 LAB — LIPID PANEL
Cholesterol: 183 mg/dL (ref <200)
HDL: 40 mg/dL (ref >=40)
LDL Cholesterol (Calc): 118 mg/dL (calc) — ABNORMAL HIGH
Non-HDL Cholesterol (Calc): 143 mg/dL (calc) — ABNORMAL HIGH (ref <130)
Total CHOL/HDL Ratio: 4.6 (calc) (ref <5.0)
Triglycerides: 141 mg/dL (ref <150)

## 2022-09-28 NOTE — Patient Instructions (Addendum)
It was a pleasure to see you today.  Blood pressure stable on Ramipril 10 mg daily.  However triglycerides remain elevated and LDL is also elevated this year or 118 and was 89 last year.  BMI is elevated at 33.40.  I would like for you to weigh less than 200 pounds.  Please work hard on diet exercise and weight loss.  Return in 1 year or as needed.  Continue Ramipril.  Screening colonoscopy is now due as well.

## 2022-10-10 ENCOUNTER — Other Ambulatory Visit: Payer: Self-pay | Admitting: Internal Medicine

## 2023-09-16 NOTE — Progress Notes (Signed)
 Annual Wellness Visit   Patient Care Team: Debi Cousin, Jaynie Meyers, MD as PCP - General (Internal Medicine) Merriam Abbey, DO as Consulting Physician (Neurology)  Visit Date: 09/26/23   Chief Complaint  Patient presents with   Annual Exam   Subjective:  Patient: Sergio Sparks, Male DOB: 13-Nov-1975, 48 y.o. MRN: [id number] Sergio Sparks is a 48 y.o. Male who presents today for his Annual Wellness Visit. Patient has Hypertension; Hypertriglyceridemia; Glucose Intolerance (Impaired Glucose Tolerance); Obesity; Metabolic Syndrome; Low HDL (Under 40); Obstructive Sleep Apnea; Chronic Pansinusitis; and Diplopia.  History of Hypertension treated with Ramipril  10 mg daily. Blood Pressure: normotensive today at 128/80.   History of Hypertriglyceridemia; Low HDL (<40) with 09/16/2022 Lipid Panel: LDL 118; otherwise WNL.  History of Impaired Glucose Tolerance with 09/16/2022 HgbA1c 6.0.   History of Obesity; BMI 33.63; weight 234 pounds 6.4 oz, a decrease from 09/2022 when he was 236 pounds 1.9 oz; BMI 33.9 and in 09/2021 was 234 pounds 4 oz; BMI 33.4.   History of Sleep Apnea untreated per his preference.   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 Last visit on file from [place] was in 2021 - discussed repeat eye exams. Previously he has said that his diplopia is usually worse in the evenings when he is fatigued. Prior to onset there was no associated head trauma and his Brain & Orbits MRI was negative in 06/2019 when he was seen by [facility].   Labs 09/26/2022 CBC: WNL CMP: WNL   Colonoscopy will be due after 06/19/2024.  PSA  0.14 in 2016   Vaccine Counseling: Due for Covid-19, PNA, and Tdap - discussed, he declines Covid-19 and PNA, but is agreeable to updating Tdap today; UTD on Flu.  Medical/Surgical History Narrative: has no past medical  history on file.  Allergic/Intolerant to: No Known Allergies  2021 - found to have Chronic Pansinusitis by an ENT physician and treated with abx in March, which improved so he opted to not do endoscopic sinus surgery and declined allergy testing, which was suggested. Vasectomy consult with Dr. Parke Boll in March, subsequent vasectomy on the 25th. In May he suffered a contusion of his right buttock after falling when a horse ran out from under him.  No past surgical history on file. Family History  Problem Relation Age of Onset   Diabetes Mother    Hypertension Mother    Diabetes Father    Hypertension Father   Family History Narrative: Father w/ hx of Diabetes, Hypertension, Hyperlipidemia, Thyroid Cancer s/p Thyroidectomy Mother w/ hx of Diabetes, Hypertension, and Obesity Brother in good health Social History   Social History Narrative   Lives in [place], [place] , which is about 30 miles from [place]. Married with 3 children. Non-smoker, social alcohol consumption. Previously he worked in [place]  and now works in [place].    2025 - Children are now 14, 10, and 4. Works 7a - 5p.    Review of Systems  Constitutional:  Negative for chills, fever, malaise/fatigue and weight loss.  HENT:  Negative for hearing loss, sinus pain and sore throat.   Respiratory:  Negative for cough, hemoptysis and shortness of breath.   Cardiovascular:  Negative for chest pain, palpitations, leg swelling and PND.  Gastrointestinal:  Negative for abdominal pain, constipation, diarrhea, heartburn, nausea and vomiting.  Genitourinary:  Negative for dysuria, frequency and urgency.  Musculoskeletal:  Negative for back pain, myalgias and neck pain.  Skin:  Negative for itching and rash.  Neurological:  Negative for dizziness, tingling, seizures and headaches.  Endo/Heme/Allergies:  Negative for polydipsia.  Psychiatric/Behavioral:  Negative for depression. The patient is not nervous/anxious.      Objective:  Vitals: BP 128/80   Pulse 71   Temp 99.2 F (37.3 C) (Temporal)   Ht 5\' 10"  (1.778 m)   Wt 234 lb 6.4 oz (106.3 kg)   SpO2 95%   BMI 33.63 kg/m  Physical Exam Vitals and nursing note reviewed.  Constitutional:      General: He is awake. He is not in acute distress.    Appearance: Normal appearance. He is not ill-appearing or toxic-appearing.  HENT:     Head: Normocephalic and atraumatic.     Right Ear: Tympanic membrane, ear canal and external ear normal.     Left Ear: Tympanic membrane, ear canal and external ear normal.     Mouth/Throat:     Pharynx: Oropharynx is clear.  Eyes:     Extraocular Movements: Extraocular movements intact.     Pupils: Pupils are equal, round, and reactive to light.  Neck:     Thyroid: No thyroid mass, thyromegaly or thyroid tenderness.     Vascular: No carotid bruit.  Cardiovascular:     Rate and Rhythm: Normal rate and regular rhythm. No extrasystoles are present.    Pulses:          Dorsalis pedis pulses are 2+ on the right side and 2+ on the left side.       Posterior tibial pulses are 2+ on the right side and 2+ on the left side.     Heart sounds: Normal heart sounds. No murmur heard.    No friction rub. No gallop.  Pulmonary:     Effort: Pulmonary effort is normal.     Breath sounds: Normal breath sounds. No decreased breath sounds, wheezing, rhonchi or rales.  Chest:     Chest wall: No mass.  Abdominal:     Palpations: Abdomen is soft. There is no hepatomegaly, splenomegaly or mass.     Tenderness: There is no abdominal tenderness.     Hernia: No hernia is present.  Musculoskeletal:     Cervical back: Normal range of motion.     Right lower leg: No edema.     Left lower leg: No edema.     Right foot: No deformity.     Left foot: No deformity.  Feet:     Right foot:     Skin integrity: Skin integrity normal.     Left foot:     Skin integrity: Skin integrity normal.  Lymphadenopathy:     Cervical: No cervical  adenopathy.     Upper Body:     Right upper body: No supraclavicular adenopathy.     Left upper body: No supraclavicular adenopathy.  Skin:    General: Skin is warm and dry.  Neurological:     General: No focal deficit present.     Mental Status: He is alert and oriented to person, place, and time. Mental status is at baseline.     Cranial Nerves: Cranial nerves 2-12 are intact.     Sensory: Sensation is intact.     Motor: Motor function is intact.     Coordination: Coordination is intact.     Gait: Gait is intact.     Deep Tendon Reflexes: Reflexes are normal and  symmetric.  Psychiatric:        Attention and Perception: Attention normal.        Mood and Affect: Mood normal.        Speech: Speech normal.        Behavior: Behavior normal. Behavior is cooperative.        Thought Content: Thought content normal.        Cognition and Memory: Cognition and memory normal.        Judgment: Judgment normal.   Most Recent Fall Risk Assessment:    09/26/2023   10:01 AM  Fall Risk   Falls in the past year? 0  Number falls in past yr: 0  Risk for fall due to : No Fall Risks  Follow up Falls evaluation completed   Most Recent Depression Screenings:    09/26/2023   10:01 AM 09/16/2022   10:10 AM  PHQ 2/9 Scores  PHQ - 2 Score 0 0   Results:  Studies Obtained And Personally Reviewed By Me:  Diabetic Foot Exam - Simple   Simple Foot Form Diabetic Foot exam was performed with the following findings: Yes 09/26/2023 10:31 AM  Visual Inspection No deformities, no ulcerations, no other skin breakdown bilaterally: Yes Sensation Testing Intact to touch and monofilament testing bilaterally: Yes Pulse Check Posterior Tibialis and Dorsalis pulse intact bilaterally: Yes Comments    Labs:     Component Value Date/Time   NA 140 09/16/2022 1143   K 4.2 09/16/2022 1143   CL 102 09/16/2022 1143   CO2 27 09/16/2022 1143   GLUCOSE 96 09/16/2022 1143   BUN 12 09/16/2022 1143   CREATININE  0.80 09/16/2022 1143   CALCIUM 9.5 09/16/2022 1143   PROT 7.5 09/16/2022 1143   ALBUMIN 4.4 05/17/2016 1150   AST 16 09/16/2022 1143   ALT 24 09/16/2022 1143   ALKPHOS 57 05/17/2016 1150   BILITOT 0.6 09/16/2022 1143   GFRNONAA 108 09/08/2020 1148   GFRAA 125 09/08/2020 1148    Lab Results  Component Value Date   WBC 6.9 09/16/2022   HGB 14.4 09/16/2022   HCT 44.1 09/16/2022   MCV 86.6 09/16/2022   PLT 327 09/16/2022   Lab Results  Component Value Date   CHOL 183 09/16/2022   HDL 40 09/16/2022   LDLCALC 118 (H) 09/16/2022   TRIG 141 09/16/2022   CHOLHDL 4.6 09/16/2022   Lab Results  Component Value Date   HGBA1C 6.0 (H) 09/16/2022    Lab Results  Component Value Date   TSH 1.72 06/24/2019    Lab Results  Component Value Date   PSA 0.14 05/16/2015   PSA 0.14 02/12/2013     Assessment & Plan:   Orders Placed This Encounter  Procedures   Tdap vaccine greater than or equal to 7yo IM   Lipid panel   COMPLETE METABOLIC PANEL WITHOUT GFR   CBC with Differential/Platelet   PSA   CBC with Differential/Platelet   COMPLETE METABOLIC PANEL WITH eGFR   Lipid panel   Hemoglobin A1c   TSH   POCT URINALYSIS DIP (CLINITEK)  No orders of the defined types were placed in this encounter. Labs above ordered today. Labs from 2024 reviewed.  Hypertension treated with Ramipril  10 mg daily. Blood Pressure: normotensive today at 128/80.   Hypertriglyceridemia; Low HDL (<40) with 09/16/2022 Lipid Panel: LDL 118; otherwise WNL.  Impaired Glucose Tolerance with 09/16/2022 HgbA1c 6.0.   Obesity; BMI 33.63; weight 234 pounds 6.4 oz, a decrease from  09/2022 when he was 236 pounds 1.9 oz; BMI 33.9 and in 09/2021 was 234 pounds 4 oz; BMI 33.4.   Sleep Apnea untreated per his preference.   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 Last visit on file from  [place] was in 2021 - discussed repeat eye exams. Previously he has said that his diplopia is usually worse in the evenings when he is fatigued. Prior to onset there was no associated head trauma and his Brain & Orbits MRI was negative in 06/2019 when he was seen by [facility].   Colonoscopy will be due after 06/19/2024.  PSA  0.14 in 2016   Vaccine Counseling: Due for Covid-19, PNA, and Tdap - discussed, he declines Covid-19 and PNA, but is agreeable to updating Tdap today; UTD on Flu.   Annual wellness visit done today including the all of the following: Reviewed patient's Family Medical History Reviewed and updated list of patient's medical providers Assessment of cognitive impairment was done Assessed patient's functional ability Established a written schedule for health screening services Health Risk Assessent Completed and Reviewed  Discussed health benefits of physical activity, and encouraged him to engage in regular exercise appropriate for his age and condition.    I,Emily Lagle,acting as a Neurosurgeon for Sylvan Evener, MD.,have documented all relevant documentation on the behalf of Sylvan Evener, MD,as directed by  Sylvan Evener, MD while in the presence of Sylvan Evener, MD.   I, Sylvan Evener, MD, have reviewed all documentation for this visit. The documentation on 09/29/23 for the exam, diagnosis, procedures, and orders are all accurate and complete.

## 2023-09-19 ENCOUNTER — Encounter: Payer: Managed Care, Other (non HMO) | Admitting: Internal Medicine

## 2023-09-26 ENCOUNTER — Other Ambulatory Visit: Payer: Managed Care, Other (non HMO)

## 2023-09-26 ENCOUNTER — Ambulatory Visit: Payer: Managed Care, Other (non HMO) | Admitting: Internal Medicine

## 2023-09-26 ENCOUNTER — Encounter: Payer: Self-pay | Admitting: Internal Medicine

## 2023-09-26 VITALS — BP 128/80 | HR 71 | Temp 99.2°F | Ht 70.0 in | Wt 234.4 lb

## 2023-09-26 DIAGNOSIS — R7302 Impaired glucose tolerance (oral): Secondary | ICD-10-CM

## 2023-09-26 DIAGNOSIS — Z23 Encounter for immunization: Secondary | ICD-10-CM | POA: Diagnosis not present

## 2023-09-26 DIAGNOSIS — Z0000 Encounter for general adult medical examination without abnormal findings: Secondary | ICD-10-CM

## 2023-09-26 DIAGNOSIS — Z6833 Body mass index (BMI) 33.0-33.9, adult: Secondary | ICD-10-CM

## 2023-09-26 DIAGNOSIS — E781 Pure hyperglyceridemia: Secondary | ICD-10-CM | POA: Diagnosis not present

## 2023-09-26 DIAGNOSIS — E669 Obesity, unspecified: Secondary | ICD-10-CM

## 2023-09-26 DIAGNOSIS — G4730 Sleep apnea, unspecified: Secondary | ICD-10-CM

## 2023-09-26 DIAGNOSIS — I10 Essential (primary) hypertension: Secondary | ICD-10-CM

## 2023-09-26 LAB — POCT URINALYSIS DIP (CLINITEK)
Bilirubin, UA: NEGATIVE
Blood, UA: NEGATIVE
Glucose, UA: NEGATIVE mg/dL
Ketones, POC UA: NEGATIVE mg/dL
Leukocytes, UA: NEGATIVE
Nitrite, UA: NEGATIVE
POC PROTEIN,UA: NEGATIVE
Spec Grav, UA: <=1.005 — AB (ref 1.010–1.025)
Urobilinogen, UA: 0.2 U/dL
pH, UA: 6 (ref 5.0–8.0)

## 2023-09-27 LAB — CBC WITH DIFFERENTIAL/PLATELET
Absolute Lymphocytes: 1907 {cells}/uL (ref 850–3900)
Absolute Monocytes: 403 {cells}/uL (ref 200–950)
Basophils Absolute: 19 {cells}/uL (ref 0–200)
Basophils Relative: 0.3 %
Eosinophils Absolute: 179 {cells}/uL (ref 15–500)
Eosinophils Relative: 2.8 %
HCT: 42.5 % (ref 38.5–50.0)
Hemoglobin: 14 g/dL (ref 13.2–17.1)
MCH: 29 pg (ref 27.0–33.0)
MCHC: 32.9 g/dL (ref 32.0–36.0)
MCV: 88 fL (ref 80.0–100.0)
MPV: 9.1 fL (ref 7.5–12.5)
Monocytes Relative: 6.3 %
Neutro Abs: 3891 {cells}/uL (ref 1500–7800)
Neutrophils Relative %: 60.8 %
Platelets: 306 10*3/uL (ref 140–400)
RBC: 4.83 10*6/uL (ref 4.20–5.80)
RDW: 12.5 % (ref 11.0–15.0)
Total Lymphocyte: 29.8 %
WBC: 6.4 10*3/uL (ref 3.8–10.8)

## 2023-09-27 LAB — COMPLETE METABOLIC PANEL WITHOUT GFR
AG Ratio: 1.8 (calc) (ref 1.0–2.5)
ALT: 31 U/L (ref 9–46)
AST: 19 U/L (ref 10–40)
Albumin: 4.6 g/dL (ref 3.6–5.1)
Alkaline phosphatase (APISO): 70 U/L (ref 36–130)
BUN: 10 mg/dL (ref 7–25)
CO2: 28 mmol/L (ref 20–32)
Calcium: 9.3 mg/dL (ref 8.6–10.3)
Chloride: 103 mmol/L (ref 98–110)
Creat: 0.7 mg/dL (ref 0.60–1.29)
Globulin: 2.5 g/dL (ref 1.9–3.7)
Glucose, Bld: 98 mg/dL (ref 65–99)
Potassium: 4.1 mmol/L (ref 3.5–5.3)
Sodium: 139 mmol/L (ref 135–146)
Total Bilirubin: 0.6 mg/dL (ref 0.2–1.2)
Total Protein: 7.1 g/dL (ref 6.1–8.1)

## 2023-09-27 LAB — LIPID PANEL
Cholesterol: 187 mg/dL (ref <200)
HDL: 37 mg/dL — ABNORMAL LOW (ref >=40)
LDL Cholesterol (Calc): 119 mg/dL — ABNORMAL HIGH
Non-HDL Cholesterol (Calc): 150 mg/dL — ABNORMAL HIGH (ref <130)
Total CHOL/HDL Ratio: 5.1 (calc) — ABNORMAL HIGH (ref <5.0)
Triglycerides: 185 mg/dL — ABNORMAL HIGH (ref <150)

## 2023-09-27 LAB — HEMOGLOBIN A1C
Hgb A1c MFr Bld: 6 % — ABNORMAL HIGH (ref <5.7)
Mean Plasma Glucose: 126 mg/dL
eAG (mmol/L): 7 mmol/L

## 2023-09-27 LAB — TSH: TSH: 1.59 m[IU]/L (ref 0.40–4.50)

## 2023-09-29 ENCOUNTER — Encounter: Payer: Self-pay | Admitting: Internal Medicine

## 2023-09-29 NOTE — Patient Instructions (Addendum)
 It was a pleasure to see you today. Please work on diet and exercise regimen. Continue same medications. I would suggest low dose statin medication for mild elevation of triglycerides and cholesterol. We discussed but pt. Prefers not to take this medication.  Return in one year or as needed.

## 2023-10-06 ENCOUNTER — Other Ambulatory Visit: Payer: Self-pay | Admitting: Internal Medicine

## 2024-01-30 ENCOUNTER — Encounter: Payer: Self-pay | Admitting: Internal Medicine

## 2024-02-03 NOTE — Telephone Encounter (Signed)
 Working on it.

## 2024-04-02 ENCOUNTER — Other Ambulatory Visit

## 2024-04-02 DIAGNOSIS — Z6833 Body mass index (BMI) 33.0-33.9, adult: Secondary | ICD-10-CM

## 2024-04-02 DIAGNOSIS — E781 Pure hyperglyceridemia: Secondary | ICD-10-CM

## 2024-04-02 DIAGNOSIS — I10 Essential (primary) hypertension: Secondary | ICD-10-CM

## 2024-04-02 DIAGNOSIS — R7302 Impaired glucose tolerance (oral): Secondary | ICD-10-CM

## 2024-04-02 MED ORDER — AMOXICILLIN 500 MG PO CAPS: 500.0000 mg | ORAL_CAPSULE | Freq: Three times a day (TID) | ORAL | 0 refills | Status: AC

## 2024-04-02 NOTE — Progress Notes (Signed)
 He came in for lab visit only. Tells me he has had a sinus infection x 3 weeks with green nasal discharge. No fever or chills. Sounds nasally congested when he speaks. Sending in Amoxicillin  500 mg 3 times daily for 10 days to [facility], [place]

## 2024-04-03 LAB — HEPATIC FUNCTION PANEL
AG Ratio: 1.6 (calc) (ref 1.0–2.5)
ALT: 27 U/L (ref 9–46)
AST: 14 U/L (ref 10–40)
Albumin: 4.5 g/dL (ref 3.6–5.1)
Alkaline phosphatase (APISO): 74 U/L (ref 36–130)
Bilirubin, Direct: 0.1 mg/dL (ref 0.0–0.2)
Globulin: 2.8 g/dL (ref 1.9–3.7)
Indirect Bilirubin: 0.5 mg/dL (ref 0.2–1.2)
Total Bilirubin: 0.6 mg/dL (ref 0.2–1.2)
Total Protein: 7.3 g/dL (ref 6.1–8.1)

## 2024-04-03 LAB — LIPID PANEL
Cholesterol: 192 mg/dL (ref <200)
HDL: 37 mg/dL — ABNORMAL LOW (ref >=40)
LDL Cholesterol (Calc): 128 mg/dL — ABNORMAL HIGH
Non-HDL Cholesterol (Calc): 155 mg/dL — ABNORMAL HIGH (ref <130)
Total CHOL/HDL Ratio: 5.2 (calc) — ABNORMAL HIGH (ref <5.0)
Triglycerides: 152 mg/dL — ABNORMAL HIGH (ref <150)

## 2024-04-03 LAB — MICROALBUMIN / CREATININE URINE RATIO
Creatinine, Urine: 51 mg/dL (ref 20–320)
Microalb Creat Ratio: 4 mg/g{creat} (ref <30)
Microalb, Ur: 0.2 mg/dL

## 2024-04-03 LAB — HEMOGLOBIN A1C
Hgb A1c MFr Bld: 5.9 % — ABNORMAL HIGH (ref <5.7)
Mean Plasma Glucose: 123 mg/dL
eAG (mmol/L): 6.8 mmol/L

## 2024-04-09 ENCOUNTER — Ambulatory Visit (INDEPENDENT_AMBULATORY_CARE_PROVIDER_SITE_OTHER): Admitting: Internal Medicine

## 2024-04-09 ENCOUNTER — Encounter: Payer: Self-pay | Admitting: Internal Medicine

## 2024-04-09 VITALS — BP 120/70 | HR 84 | Ht 70.0 in | Wt 239.0 lb

## 2024-04-09 DIAGNOSIS — E669 Obesity, unspecified: Secondary | ICD-10-CM | POA: Diagnosis not present

## 2024-04-09 DIAGNOSIS — E1169 Type 2 diabetes mellitus with other specified complication: Secondary | ICD-10-CM

## 2024-04-09 DIAGNOSIS — R7302 Impaired glucose tolerance (oral): Secondary | ICD-10-CM

## 2024-04-09 DIAGNOSIS — I10 Essential (primary) hypertension: Secondary | ICD-10-CM | POA: Diagnosis not present

## 2024-04-09 DIAGNOSIS — Z6834 Body mass index (BMI) 34.0-34.9, adult: Secondary | ICD-10-CM

## 2024-04-09 DIAGNOSIS — E781 Pure hyperglyceridemia: Secondary | ICD-10-CM

## 2024-04-09 NOTE — Progress Notes (Signed)
 Patient Care Team: Perri Ronal PARAS, MD as PCP - General (Internal Medicine) Skeet Juliene SAUNDERS, DO as Consulting Physician (Neurology)  Visit Date: 04/09/24  Subjective:    Patient ID: Sergio Sparks , Male   DOB: 1975/09/26, 48 y.o.    MRN: [id number]   48 y.o. Male presents today for 6 month follow up for Hypertension, Hypertriglyceridemia, Impaired glucose tolerance. Patient has a past medical history of obesity, sleep apnea.  He noticed some fullness in his epididymis and was concerned. On physical exam there was no mass felt upon palpation.   History of Hypertension treated with Ramipril  10 mg. Blood pressure today is normal at 120/70. He expressed interest in getting a CT cardiac score.   History of Hypertriglyceridemia. 04/02/2024 Lipid Panel CHOL 192, HDL 37, Triglycerides 152, LDL 128. He has expressed disinterest about taking statins.   History of impaired glucose tolerance, controlled with diet and exercise. 04/02/2024 HgbA1c 5.9%.   History of Obesity; Current weight 239 lb BMI 34.29. He has gained 4 pounds since his last visit here on 09/26/2023.   History of sleep apnea untreated per his preference.    Health Maintenance: he was informed that his colonoscopy and eye exam are both due.     Family History  Problem Relation Age of Onset   Diabetes Mother    Hypertension Mother    Diabetes Father    Hypertension Father     Social History   Social History Narrative   Lives in [place], [place] , which is about 30 miles from [place]. Married with 3 children. Non-smoker, social alcohol consumption. Previously he worked in [employer]  and now works in [place].    2025 - Children are now 14, 10, and 4. Works 7a - 5p.       Review of Systems  Constitutional:  Negative for fever and malaise/fatigue.  HENT:  Negative for congestion.   Eyes:  Negative for blurred vision.  Respiratory:  Negative for cough and shortness of breath.   Cardiovascular:  Negative for  chest pain, palpitations and leg swelling.  Gastrointestinal:  Negative for vomiting.  Genitourinary:        Tenderness in Epididymis   Musculoskeletal:  Negative for back pain.  Skin:  Negative for rash.  Neurological:  Negative for loss of consciousness and headaches.  All other systems reviewed and are negative.       Objective:   Vitals: BP 120/70   Pulse 84   Ht 5' 10 (1.778 m)   Wt 239 lb (108.4 kg)   SpO2 97%   BMI 34.29 kg/m    Physical Exam Vitals and nursing note reviewed. Exam conducted with a chaperone present.  Constitutional:      General: He is awake. He is not in acute distress.    Appearance: Normal appearance. He is not ill-appearing or toxic-appearing.  HENT:     Head: Normocephalic and atraumatic.     Right Ear: Tympanic membrane, ear canal and external ear normal.     Left Ear: Tympanic membrane, ear canal and external ear normal.     Mouth/Throat:     Pharynx: Oropharynx is clear.  Eyes:     Extraocular Movements: Extraocular movements intact.     Pupils: Pupils are equal, round, and reactive to light.  Neck:     Thyroid: No thyroid mass, thyromegaly or thyroid tenderness.     Vascular: No carotid bruit.  Cardiovascular:     Rate and Rhythm: Normal rate and  regular rhythm. No extrasystoles are present.    Pulses:          Dorsalis pedis pulses are 2+ on the right side and 2+ on the left side.       Posterior tibial pulses are 2+ on the right side and 2+ on the left side.     Heart sounds: Normal heart sounds. No murmur heard.    No friction rub. No gallop.  Pulmonary:     Effort: Pulmonary effort is normal.     Breath sounds: Normal breath sounds. No decreased breath sounds, wheezing, rhonchi or rales.  Chest:     Chest wall: No mass.  Abdominal:     Palpations: Abdomen is soft. There is no hepatomegaly, splenomegaly or mass.     Tenderness: There is no abdominal tenderness.     Hernia: No hernia is present.  Genitourinary:     Epididymis:     Right: Normal. No mass.     Left: Normal. No mass.     Prostate: Normal. Not enlarged, not tender and no nodules present.     Rectum: Normal. Guaiac result negative.  Musculoskeletal:     Cervical back: Normal range of motion.     Right lower leg: No edema.     Left lower leg: No edema.  Lymphadenopathy:     Cervical: No cervical adenopathy.     Upper Body:     Right upper body: No supraclavicular adenopathy.     Left upper body: No supraclavicular adenopathy.  Skin:    General: Skin is warm and dry.  Neurological:     General: No focal deficit present.     Mental Status: He is alert and oriented to person, place, and time. Mental status is at baseline.     Cranial Nerves: Cranial nerves 2-12 are intact.     Sensory: Sensation is intact.     Motor: Motor function is intact.     Coordination: Coordination is intact.     Gait: Gait is intact.     Deep Tendon Reflexes: Reflexes are normal and symmetric.  Psychiatric:        Attention and Perception: Attention normal.        Mood and Affect: Mood normal.        Speech: Speech normal.        Behavior: Behavior normal. Behavior is cooperative.        Thought Content: Thought content normal.        Cognition and Memory: Cognition and memory normal.        Judgment: Judgment normal.       Results:     Labs:       Component Value Date/Time   NA 139 09/26/2023 1213   K 4.1 09/26/2023 1213   CL 103 09/26/2023 1213   CO2 28 09/26/2023 1213   GLUCOSE 98 09/26/2023 1213   BUN 10 09/26/2023 1213   CREATININE 0.70 09/26/2023 1213   CALCIUM 9.3 09/26/2023 1213   PROT 7.3 04/02/2024 0925   ALBUMIN 4.4 05/17/2016 1150   AST 14 04/02/2024 0925   ALT 27 04/02/2024 0925   ALKPHOS 57 05/17/2016 1150   BILITOT 0.6 04/02/2024 0925   GFRNONAA 108 09/08/2020 1148   GFRAA 125 09/08/2020 1148     Lab Results  Component Value Date   WBC 6.4 09/26/2023   HGB 14.0 09/26/2023   HCT 42.5 09/26/2023   MCV 88.0  09/26/2023   PLT 306 09/26/2023  Lab Results  Component Value Date   CHOL 192 04/02/2024   HDL 37 (L) 04/02/2024   LDLCALC 128 (H) 04/02/2024   TRIG 152 (H) 04/02/2024   CHOLHDL 5.2 (H) 04/02/2024    Lab Results  Component Value Date   HGBA1C 5.9 (H) 04/02/2024     Lab Results  Component Value Date   TSH 1.59 09/26/2023     Lab Results  Component Value Date   PSA 0.14 05/16/2015   PSA 0.14 02/12/2013     Assessment & Plan:   Orders Placed This Encounter  Procedures   CT CARDIAC SCORING (SELF PAY ONLY)    Preferred imaging location?:   [facility]    He noticed some fullness in his epididymis and was concerned. On physical exam there was no mass felt upon palpation.   Hypertension: treated with Ramipril  10 mg. Blood pressure today is normal at 120/70. He expressed interest in getting a CT cardiac score.    CT cardiac score ordered.   Hypertriglyceridemia: 04/02/2024 Lipid Panel CHOL 192, HDL 37, Triglycerides 152, LDL 128. He has expressed disinterest about taking statins.   Impaired glucose tolerance: controlled with diet and exercise. 04/02/2024 HgbA1c 5.9%.   Obesity: Current weight 239 lb BMI 34.29. He has gained 4 pounds since his last visit here on 09/26/2023.   History of sleep apnea untreated per his preference.    Health Maintenance: he was informed that his colonoscopy and eye exam are both due.    Plan: continue current medications. Return in 6 months for CPE. Try to lose aome weight. Coronary calcium scoring ordered. Please call the number given for an appt.   I,Makayla C Reid,acting as a scribe for Ronal JINNY Hailstone, MD.,have documented all relevant documentation on the behalf of Ronal JINNY Hailstone, MD,as directed by  Ronal JINNY Hailstone, MD while in the presence of Ronal JINNY Hailstone, MD.   I, Ronal JINNY Hailstone, MD, have reviewed all documentation for this visit. The documentation on 04/09/2024 for the exam, diagnosis, procedures, and orders are all accurate  and complete.

## 2024-04-18 NOTE — Patient Instructions (Signed)
 Continue current medications. Please call number given for  appt for coronary calcium scoring.Watch diet and try to lose some weight. Retirn in May 2026 for physical exam and fasting labs. It was a pleasure to see you today.

## 2024-10-12 ENCOUNTER — Other Ambulatory Visit

## 2024-10-15 ENCOUNTER — Encounter: Admitting: Internal Medicine
